# Patient Record
Sex: Male | Born: 1960 | Race: White | Hispanic: No | Marital: Single | Smoking: Current every day smoker
Health system: Southern US, Community
[De-identification: ages and names within clinical notes are randomized; demographics above are authoritative.]

## PROBLEM LIST (undated history)

## (undated) DIAGNOSIS — Z5900 Homelessness unspecified: Secondary | ICD-10-CM

## (undated) DIAGNOSIS — K623 Rectal prolapse: Secondary | ICD-10-CM

## (undated) DIAGNOSIS — F1910 Other psychoactive substance abuse, uncomplicated: Secondary | ICD-10-CM

## (undated) DIAGNOSIS — I10 Essential (primary) hypertension: Secondary | ICD-10-CM

## (undated) DIAGNOSIS — R4689 Other symptoms and signs involving appearance and behavior: Secondary | ICD-10-CM

## (undated) DIAGNOSIS — D649 Anemia, unspecified: Secondary | ICD-10-CM

## (undated) DIAGNOSIS — R4589 Other symptoms and signs involving emotional state: Secondary | ICD-10-CM

## (undated) DIAGNOSIS — K219 Gastro-esophageal reflux disease without esophagitis: Secondary | ICD-10-CM

## (undated) DIAGNOSIS — F329 Major depressive disorder, single episode, unspecified: Secondary | ICD-10-CM

## (undated) DIAGNOSIS — K922 Gastrointestinal hemorrhage, unspecified: Secondary | ICD-10-CM

## (undated) DIAGNOSIS — F32A Depression, unspecified: Secondary | ICD-10-CM

## (undated) DIAGNOSIS — J449 Chronic obstructive pulmonary disease, unspecified: Secondary | ICD-10-CM

## (undated) HISTORY — DX: Homelessness unspecified: Z59.00

## (undated) HISTORY — DX: Other symptoms and signs involving emotional state: R45.89

## (undated) HISTORY — DX: Gastrointestinal hemorrhage, unspecified: K92.2

## (undated) HISTORY — DX: Anemia, unspecified: D64.9

## (undated) HISTORY — DX: Homelessness: Z59.0

## (undated) HISTORY — DX: Other psychoactive substance abuse, uncomplicated: F19.10

## (undated) HISTORY — DX: Rectal prolapse: K62.3

## (undated) HISTORY — DX: Other symptoms and signs involving appearance and behavior: R46.89

---

## 2007-08-31 ENCOUNTER — Emergency Department (HOSPITAL_COMMUNITY): Admission: EM | Admit: 2007-08-31 | Discharge: 2007-08-31 | Payer: Self-pay | Admitting: Emergency Medicine

## 2007-12-05 ENCOUNTER — Emergency Department (HOSPITAL_COMMUNITY): Admission: EM | Admit: 2007-12-05 | Discharge: 2007-12-06 | Payer: Self-pay | Admitting: Emergency Medicine

## 2009-01-22 ENCOUNTER — Emergency Department (HOSPITAL_COMMUNITY): Admission: EM | Admit: 2009-01-22 | Discharge: 2009-01-22 | Payer: Self-pay | Admitting: *Deleted

## 2009-10-18 HISTORY — PX: LEG SURGERY: SHX1003

## 2010-02-18 ENCOUNTER — Inpatient Hospital Stay (HOSPITAL_COMMUNITY): Admission: EM | Admit: 2010-02-18 | Discharge: 2010-02-24 | Payer: Self-pay

## 2010-06-09 ENCOUNTER — Ambulatory Visit: Payer: Self-pay | Admitting: Internal Medicine

## 2010-07-09 ENCOUNTER — Emergency Department (HOSPITAL_COMMUNITY): Admission: EM | Admit: 2010-07-09 | Discharge: 2010-07-09 | Payer: Self-pay | Admitting: Emergency Medicine

## 2010-07-20 ENCOUNTER — Emergency Department (HOSPITAL_COMMUNITY): Admission: EM | Admit: 2010-07-20 | Discharge: 2010-07-20 | Payer: Self-pay | Admitting: Emergency Medicine

## 2010-11-08 ENCOUNTER — Encounter: Payer: Self-pay | Admitting: Neurosurgery

## 2010-12-31 LAB — ETHANOL: Alcohol, Ethyl (B): 224 mg/dL — ABNORMAL HIGH (ref 0–10)

## 2011-01-05 LAB — IRON AND TIBC
Saturation Ratios: 3 % — ABNORMAL LOW (ref 20–55)
UIBC: 394 ug/dL

## 2011-01-05 LAB — CBC
HCT: 26.4 % — ABNORMAL LOW (ref 39.0–52.0)
HCT: 26.6 % — ABNORMAL LOW (ref 39.0–52.0)
HCT: 27.2 % — ABNORMAL LOW (ref 39.0–52.0)
MCHC: 31.3 g/dL (ref 30.0–36.0)
MCHC: 31.7 g/dL (ref 30.0–36.0)
MCHC: 31.8 g/dL (ref 30.0–36.0)
MCV: 67.8 fL — ABNORMAL LOW (ref 78.0–100.0)
MCV: 68.1 fL — ABNORMAL LOW (ref 78.0–100.0)
MCV: 68.2 fL — ABNORMAL LOW (ref 78.0–100.0)
MCV: 68.9 fL — ABNORMAL LOW (ref 78.0–100.0)
Platelets: 387 10*3/uL (ref 150–400)
Platelets: 484 10*3/uL — ABNORMAL HIGH (ref 150–400)
RBC: 3.89 MIL/uL — ABNORMAL LOW (ref 4.22–5.81)
RBC: 3.99 MIL/uL — ABNORMAL LOW (ref 4.22–5.81)
RDW: 21.8 % — ABNORMAL HIGH (ref 11.5–15.5)
RDW: 22 % — ABNORMAL HIGH (ref 11.5–15.5)
RDW: 23.1 % — ABNORMAL HIGH (ref 11.5–15.5)
WBC: 7.3 10*3/uL (ref 4.0–10.5)
WBC: 7.4 10*3/uL (ref 4.0–10.5)
WBC: 8.1 10*3/uL (ref 4.0–10.5)
WBC: 9.8 10*3/uL (ref 4.0–10.5)

## 2011-01-05 LAB — BASIC METABOLIC PANEL
BUN: 6 mg/dL (ref 6–23)
CO2: 26 mEq/L (ref 19–32)
CO2: 26 mEq/L (ref 19–32)
Calcium: 8.2 mg/dL — ABNORMAL LOW (ref 8.4–10.5)
Chloride: 103 mEq/L (ref 96–112)
Chloride: 96 mEq/L (ref 96–112)
Creatinine, Ser: 0.56 mg/dL (ref 0.4–1.5)
Creatinine, Ser: 0.57 mg/dL (ref 0.4–1.5)
GFR calc Af Amer: >60 mL/min (ref >60)
GFR calc Af Amer: >60 mL/min (ref >60)
Glucose, Bld: 92 mg/dL (ref 70–99)
Glucose, Bld: 92 mg/dL (ref 70–99)
Potassium: 4 mEq/L (ref 3.5–5.1)
Sodium: 132 mEq/L — ABNORMAL LOW (ref 135–145)

## 2011-01-05 LAB — TYPE AND SCREEN
ABO/RH(D): O POS
Antibody Screen: NEGATIVE

## 2011-01-05 LAB — RETICULOCYTES
RBC.: 4.1 MIL/uL — ABNORMAL LOW (ref 4.22–5.81)
Retic Ct Pct: 1.1 % (ref 0.4–3.1)

## 2011-01-05 LAB — DIFFERENTIAL
Basophils Absolute: 0 10*3/uL (ref 0.0–0.1)
Basophils Absolute: 0.1 10*3/uL (ref 0.0–0.1)
Eosinophils Relative: 1 % (ref 0–5)
Lymphocytes Relative: 42 % (ref 12–46)
Lymphs Abs: 1.3 10*3/uL (ref 0.7–4.0)
Monocytes Relative: 14 % — ABNORMAL HIGH (ref 3–12)
Monocytes Relative: 5 % (ref 3–12)
Myelocytes: 0 %
Neutro Abs: 5 10*3/uL (ref 1.7–7.7)
Neutrophils Relative %: 51 % (ref 43–77)
nRBC: 0 /100{WBCs}

## 2011-01-05 LAB — FOLATE: Folate: 8.8 ng/mL

## 2011-01-05 LAB — POCT I-STAT, CHEM 8
BUN: 5 mg/dL — ABNORMAL LOW (ref 6–23)
Potassium: 3.7 meq/L (ref 3.5–5.1)
Sodium: 145 meq/L (ref 135–145)
TCO2: 22 mmol/L (ref 0–100)

## 2011-01-05 LAB — LACTIC ACID, PLASMA: Lactic Acid, Venous: 2.3 mmol/L — ABNORMAL HIGH (ref 0.5–2.2)

## 2011-01-05 LAB — COMPREHENSIVE METABOLIC PANEL
BUN: 5 mg/dL — ABNORMAL LOW (ref 6–23)
CO2: 24 mEq/L (ref 19–32)
Calcium: 7.9 mg/dL — ABNORMAL LOW (ref 8.4–10.5)
Chloride: 111 mEq/L (ref 96–112)
Creatinine, Ser: 0.67 mg/dL (ref 0.4–1.5)
GFR calc non Af Amer: >60 mL/min (ref >60)
Total Bilirubin: 0.3 mg/dL (ref 0.3–1.2)

## 2011-01-05 LAB — POCT CARDIAC MARKERS
CKMB, poc: 1 ng/mL (ref 1.0–8.0)
Myoglobin, poc: 338 ng/mL (ref 12–200)

## 2011-01-05 LAB — APTT: aPTT: 27 seconds (ref 24–37)

## 2011-01-05 LAB — TSH: TSH: 2.711 u[IU]/mL (ref 0.350–4.500)

## 2011-01-05 LAB — MRSA PCR SCREENING: MRSA by PCR: NEGATIVE

## 2011-01-05 LAB — HEMOGLOBIN AND HEMATOCRIT, BLOOD
HCT: 26.1 % — ABNORMAL LOW (ref 39.0–52.0)
Hemoglobin: 8.3 g/dL — ABNORMAL LOW (ref 13.0–17.0)

## 2011-01-05 LAB — PROTIME-INR
INR: 1.05 (ref 0.00–1.49)
Prothrombin Time: 13.6 seconds (ref 11.6–15.2)

## 2011-03-30 ENCOUNTER — Emergency Department (HOSPITAL_COMMUNITY): Payer: Self-pay

## 2011-03-30 ENCOUNTER — Emergency Department (HOSPITAL_COMMUNITY)
Admission: EM | Admit: 2011-03-30 | Discharge: 2011-03-30 | Disposition: A | Discharge status: Home / Self Care | Payer: Self-pay | Attending: Emergency Medicine | Admitting: Emergency Medicine

## 2011-03-30 DIAGNOSIS — G8918 Other acute postprocedural pain: Secondary | ICD-10-CM | POA: Insufficient documentation

## 2011-03-30 DIAGNOSIS — M25473 Effusion, unspecified ankle: Secondary | ICD-10-CM | POA: Insufficient documentation

## 2011-03-30 DIAGNOSIS — K219 Gastro-esophageal reflux disease without esophagitis: Secondary | ICD-10-CM | POA: Insufficient documentation

## 2011-03-30 DIAGNOSIS — M25579 Pain in unspecified ankle and joints of unspecified foot: Secondary | ICD-10-CM | POA: Insufficient documentation

## 2011-03-30 DIAGNOSIS — M25476 Effusion, unspecified foot: Secondary | ICD-10-CM | POA: Insufficient documentation

## 2011-04-01 ENCOUNTER — Ambulatory Visit (HOSPITAL_COMMUNITY): Payer: Self-pay

## 2011-04-01 ENCOUNTER — Ambulatory Visit (HOSPITAL_COMMUNITY)
Admission: RE | Admit: 2011-04-01 | Discharge: 2011-04-02 | Disposition: A | Discharge status: Home / Self Care | Payer: Self-pay | Source: Ambulatory Visit | Attending: Orthopedic Surgery | Admitting: Orthopedic Surgery

## 2011-04-01 DIAGNOSIS — Z5900 Homelessness unspecified: Secondary | ICD-10-CM | POA: Insufficient documentation

## 2011-04-01 DIAGNOSIS — Y831 Surgical operation with implant of artificial internal device as the cause of abnormal reaction of the patient, or of later complication, without mention of misadventure at the time of the procedure: Secondary | ICD-10-CM | POA: Insufficient documentation

## 2011-04-01 DIAGNOSIS — Z01812 Encounter for preprocedural laboratory examination: Secondary | ICD-10-CM | POA: Insufficient documentation

## 2011-04-01 DIAGNOSIS — T8489XA Other specified complication of internal orthopedic prosthetic devices, implants and grafts, initial encounter: Secondary | ICD-10-CM | POA: Insufficient documentation

## 2011-04-01 LAB — SURGICAL PCR SCREEN
MRSA, PCR: NEGATIVE
Staphylococcus aureus: POSITIVE — AB

## 2011-04-01 LAB — CBC
Platelets: 385 10*3/uL (ref 150–400)
RDW: 19.1 % — ABNORMAL HIGH (ref 11.5–15.5)
WBC: 11.2 10*3/uL — ABNORMAL HIGH (ref 4.0–10.5)
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
## 2011-07-02 ENCOUNTER — Emergency Department (HOSPITAL_COMMUNITY): Payer: Self-pay

## 2011-07-02 ENCOUNTER — Emergency Department (HOSPITAL_COMMUNITY)
Admission: EM | Admit: 2011-07-02 | Discharge: 2011-07-02 | Disposition: A | Discharge status: Home / Self Care | Payer: Self-pay | Attending: Emergency Medicine | Admitting: Emergency Medicine

## 2011-07-02 DIAGNOSIS — M79609 Pain in unspecified limb: Secondary | ICD-10-CM | POA: Insufficient documentation

## 2011-07-02 DIAGNOSIS — M25476 Effusion, unspecified foot: Secondary | ICD-10-CM | POA: Insufficient documentation

## 2011-07-02 DIAGNOSIS — M25473 Effusion, unspecified ankle: Secondary | ICD-10-CM | POA: Insufficient documentation

## 2011-07-02 DIAGNOSIS — M25579 Pain in unspecified ankle and joints of unspecified foot: Secondary | ICD-10-CM | POA: Insufficient documentation

## 2011-07-02 DIAGNOSIS — K219 Gastro-esophageal reflux disease without esophagitis: Secondary | ICD-10-CM | POA: Insufficient documentation

## 2011-07-22 ENCOUNTER — Emergency Department (HOSPITAL_COMMUNITY)
Admission: EM | Admit: 2011-07-22 | Discharge: 2011-07-23 | Disposition: A | Discharge status: Home / Self Care | Payer: Self-pay | Attending: Emergency Medicine | Admitting: Emergency Medicine

## 2011-07-22 DIAGNOSIS — M25476 Effusion, unspecified foot: Secondary | ICD-10-CM | POA: Insufficient documentation

## 2011-07-22 DIAGNOSIS — M25579 Pain in unspecified ankle and joints of unspecified foot: Secondary | ICD-10-CM | POA: Insufficient documentation

## 2011-07-22 DIAGNOSIS — K219 Gastro-esophageal reflux disease without esophagitis: Secondary | ICD-10-CM | POA: Insufficient documentation

## 2011-07-22 DIAGNOSIS — M25473 Effusion, unspecified ankle: Secondary | ICD-10-CM | POA: Insufficient documentation

## 2011-11-21 ENCOUNTER — Emergency Department (HOSPITAL_COMMUNITY): Payer: Self-pay

## 2011-11-21 ENCOUNTER — Encounter (HOSPITAL_COMMUNITY): Payer: Self-pay | Admitting: *Deleted

## 2011-11-21 ENCOUNTER — Emergency Department (HOSPITAL_COMMUNITY)
Admission: EM | Admit: 2011-11-21 | Discharge: 2011-11-22 | Disposition: A | Discharge status: Home / Self Care | Payer: Self-pay | Attending: Emergency Medicine | Admitting: Emergency Medicine

## 2011-11-21 DIAGNOSIS — S0003XA Contusion of scalp, initial encounter: Secondary | ICD-10-CM | POA: Insufficient documentation

## 2011-11-21 DIAGNOSIS — R6884 Jaw pain: Secondary | ICD-10-CM | POA: Insufficient documentation

## 2011-11-21 DIAGNOSIS — S0083XA Contusion of other part of head, initial encounter: Secondary | ICD-10-CM

## 2011-11-21 DIAGNOSIS — S61511A Laceration without foreign body of right wrist, initial encounter: Secondary | ICD-10-CM

## 2011-11-21 DIAGNOSIS — S61509A Unspecified open wound of unspecified wrist, initial encounter: Secondary | ICD-10-CM | POA: Insufficient documentation

## 2011-11-21 DIAGNOSIS — R51 Headache: Secondary | ICD-10-CM | POA: Insufficient documentation

## 2011-11-21 DIAGNOSIS — M542 Cervicalgia: Secondary | ICD-10-CM | POA: Insufficient documentation

## 2011-11-21 MED ORDER — TETANUS-DIPHTH-ACELL PERTUSSIS 5-2.5-18.5 LF-MCG/0.5 IM SUSP
0.5000 mL | Freq: Once | INTRAMUSCULAR | Status: AC
Start: 2011-11-21 — End: 2011-11-21
  Administered 2011-11-21: 0.5 mL via INTRAMUSCULAR
  Filled 2011-11-21: qty 0.5
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
## 2011-11-22 MED ORDER — OXYCODONE-ACETAMINOPHEN 5-325 MG PO TABS: 1.0000 | ORAL_TABLET | Freq: Four times a day (QID) | ORAL | Status: DC | PRN

## 2011-11-28 ENCOUNTER — Encounter (HOSPITAL_COMMUNITY): Payer: Self-pay | Admitting: Emergency Medicine

## 2011-11-28 ENCOUNTER — Emergency Department (HOSPITAL_COMMUNITY)
Admission: EM | Admit: 2011-11-28 | Discharge: 2011-11-28 | Disposition: A | Discharge status: Other Acute Inpatient Hospital | Payer: Self-pay | Attending: Emergency Medicine | Admitting: Emergency Medicine

## 2011-11-28 ENCOUNTER — Telehealth (HOSPITAL_COMMUNITY): Payer: Self-pay | Admitting: Licensed Clinical Social Worker

## 2011-11-28 ENCOUNTER — Emergency Department (HOSPITAL_COMMUNITY): Payer: Self-pay

## 2011-11-28 ENCOUNTER — Inpatient Hospital Stay (HOSPITAL_COMMUNITY)
Admission: RE | Admit: 2011-11-28 | Discharge: 2011-12-06 | DRG: 897 | Disposition: A | Discharge status: Home / Self Care | Payer: PRIVATE HEALTH INSURANCE | Source: Ambulatory Visit | Attending: Psychiatry | Admitting: Psychiatry

## 2011-11-28 DIAGNOSIS — F1994 Other psychoactive substance use, unspecified with psychoactive substance-induced mood disorder: Secondary | ICD-10-CM

## 2011-11-28 DIAGNOSIS — K921 Melena: Secondary | ICD-10-CM | POA: Insufficient documentation

## 2011-11-28 DIAGNOSIS — J449 Chronic obstructive pulmonary disease, unspecified: Secondary | ICD-10-CM | POA: Insufficient documentation

## 2011-11-28 DIAGNOSIS — F17200 Nicotine dependence, unspecified, uncomplicated: Secondary | ICD-10-CM | POA: Insufficient documentation

## 2011-11-28 DIAGNOSIS — F1020 Alcohol dependence, uncomplicated: Principal | ICD-10-CM

## 2011-11-28 DIAGNOSIS — F322 Major depressive disorder, single episode, severe without psychotic features: Secondary | ICD-10-CM

## 2011-11-28 DIAGNOSIS — K922 Gastrointestinal hemorrhage, unspecified: Secondary | ICD-10-CM

## 2011-11-28 DIAGNOSIS — J4489 Other specified chronic obstructive pulmonary disease: Secondary | ICD-10-CM | POA: Insufficient documentation

## 2011-11-28 DIAGNOSIS — K623 Rectal prolapse: Secondary | ICD-10-CM | POA: Insufficient documentation

## 2011-11-28 DIAGNOSIS — I10 Essential (primary) hypertension: Secondary | ICD-10-CM

## 2011-11-28 DIAGNOSIS — R45851 Suicidal ideations: Secondary | ICD-10-CM

## 2011-11-28 DIAGNOSIS — K5900 Constipation, unspecified: Secondary | ICD-10-CM | POA: Insufficient documentation

## 2011-11-28 DIAGNOSIS — K219 Gastro-esophageal reflux disease without esophagitis: Secondary | ICD-10-CM

## 2011-11-28 DIAGNOSIS — F1010 Alcohol abuse, uncomplicated: Secondary | ICD-10-CM | POA: Insufficient documentation

## 2011-11-28 DIAGNOSIS — M79609 Pain in unspecified limb: Secondary | ICD-10-CM

## 2011-11-28 DIAGNOSIS — D649 Anemia, unspecified: Secondary | ICD-10-CM | POA: Insufficient documentation

## 2011-11-28 DIAGNOSIS — Z590 Homelessness: Secondary | ICD-10-CM

## 2011-11-28 DIAGNOSIS — Z880 Allergy status to penicillin: Secondary | ICD-10-CM

## 2011-11-28 DIAGNOSIS — R51 Headache: Secondary | ICD-10-CM

## 2011-11-28 DIAGNOSIS — Z886 Allergy status to analgesic agent status: Secondary | ICD-10-CM

## 2011-11-28 DIAGNOSIS — R195 Other fecal abnormalities: Secondary | ICD-10-CM | POA: Diagnosis present

## 2011-11-28 DIAGNOSIS — F4310 Post-traumatic stress disorder, unspecified: Secondary | ICD-10-CM

## 2011-11-28 HISTORY — DX: Chronic obstructive pulmonary disease, unspecified: J44.9

## 2011-11-28 HISTORY — DX: Gastro-esophageal reflux disease without esophagitis: K21.9

## 2011-11-28 HISTORY — DX: Essential (primary) hypertension: I10

## 2011-11-28 LAB — CBC
MCHC: 32.2 g/dL (ref 30.0–36.0)
Platelets: 318 10*3/uL (ref 150–400)
RDW: 21.2 % — ABNORMAL HIGH (ref 11.5–15.5)
WBC: 6.9 10*3/uL (ref 4.0–10.5)

## 2011-11-28 LAB — COMPREHENSIVE METABOLIC PANEL
AST: 49 U/L — ABNORMAL HIGH (ref 0–37)
Albumin: 3.6 g/dL (ref 3.5–5.2)
Alkaline Phosphatase: 88 U/L (ref 39–117)
Chloride: 103 mEq/L (ref 96–112)
Potassium: 3.9 mEq/L (ref 3.5–5.1)
Total Bilirubin: 0.1 mg/dL — ABNORMAL LOW (ref 0.3–1.2)
Total Protein: 8.4 g/dL — ABNORMAL HIGH (ref 6.0–8.3)

## 2011-11-28 LAB — RAPID URINE DRUG SCREEN, HOSP PERFORMED
Amphetamines: NOT DETECTED
Barbiturates: NOT DETECTED
Benzodiazepines: NOT DETECTED
Cocaine: NOT DETECTED

## 2011-11-28 LAB — ACETAMINOPHEN LEVEL: Acetaminophen (Tylenol), Serum: <15 ug/mL (ref 10–30)

## 2011-11-28 MED ORDER — LORAZEPAM 1 MG PO TABS
1.0000 mg | ORAL_TABLET | Freq: Four times a day (QID) | ORAL | Status: DC | PRN
  Administered 2011-11-28: 1 mg via ORAL
  Filled 2011-11-28: qty 1

## 2011-11-28 MED ORDER — LORAZEPAM 1 MG PO TABS
0.0000 mg | ORAL_TABLET | Freq: Four times a day (QID) | ORAL | Status: DC
  Administered 2011-11-28: 1 mg via ORAL
  Filled 2011-11-28: qty 1

## 2011-11-28 MED ORDER — LORAZEPAM 1 MG PO TABS: 1.0000 mg | ORAL_TABLET | Freq: Three times a day (TID) | ORAL | Status: DC | PRN

## 2011-11-28 MED ORDER — ONDANSETRON HCL 4 MG PO TABS: 4.0000 mg | ORAL_TABLET | Freq: Three times a day (TID) | ORAL | Status: DC | PRN

## 2011-11-28 MED ORDER — HYDROXYZINE PAMOATE 25 MG PO CAPS: 25.0000 mg | ORAL_CAPSULE | Freq: Every evening | ORAL | Status: DC | PRN

## 2011-11-28 MED ORDER — BACITRACIN 500 UNIT/GM EX OINT
1.0000 "application " | TOPICAL_OINTMENT | Freq: Two times a day (BID) | CUTANEOUS | Status: DC
  Filled 2011-11-28: qty 0.9

## 2011-11-28 MED ORDER — LORAZEPAM 1 MG PO TABS: 1.0000 mg | ORAL_TABLET | Freq: Four times a day (QID) | ORAL | Status: DC | PRN

## 2011-11-28 MED ORDER — IOHEXOL 300 MG/ML  SOLN
100.0000 mL | Freq: Once | INTRAMUSCULAR | Status: AC | PRN
Start: 2011-11-28 — End: 2011-11-28
  Administered 2011-11-28: 100 mL via INTRAVENOUS

## 2011-11-28 MED ORDER — ACETAMINOPHEN 325 MG PO TABS
650.0000 mg | ORAL_TABLET | Freq: Four times a day (QID) | ORAL | Status: DC | PRN
  Administered 2011-11-29 – 2011-12-01 (×5): 650 mg via ORAL

## 2011-11-28 MED ORDER — MAGNESIUM HYDROXIDE 400 MG/5ML PO SUSP: 30.0000 mL | Freq: Every day | ORAL | Status: DC | PRN

## 2011-11-28 MED ORDER — BACITRACIN ZINC 500 UNIT/GM EX OINT
TOPICAL_OINTMENT | CUTANEOUS | Status: AC
  Administered 2011-11-28: 1
  Filled 2011-11-28: qty 0.9

## 2011-11-28 MED ORDER — ACETAMINOPHEN 325 MG PO TABS: 650.0000 mg | ORAL_TABLET | ORAL | Status: DC | PRN

## 2011-11-28 MED ORDER — LORAZEPAM 2 MG/ML IJ SOLN: 1.0000 mg | Freq: Four times a day (QID) | INTRAMUSCULAR | Status: DC | PRN

## 2011-11-28 MED ORDER — ZOLPIDEM TARTRATE 5 MG PO TABS: 5.0000 mg | ORAL_TABLET | Freq: Every evening | ORAL | Status: DC | PRN

## 2011-11-28 MED ORDER — BACITRACIN ZINC 500 UNIT/GM EX OINT
TOPICAL_OINTMENT | Freq: Two times a day (BID) | CUTANEOUS | Status: DC
  Administered 2011-11-28: 21:00:00 via TOPICAL
  Filled 2011-11-28: qty 15

## 2011-11-28 MED ORDER — LORAZEPAM 1 MG PO TABS: 0.0000 mg | ORAL_TABLET | Freq: Two times a day (BID) | ORAL | Status: DC

## 2011-11-28 MED ORDER — ALUM & MAG HYDROXIDE-SIMETH 200-200-20 MG/5ML PO SUSP: 30.0000 mL | ORAL | Status: DC | PRN

## 2011-11-28 MED ORDER — LORAZEPAM 2 MG/ML IJ SOLN
1.0000 mg | Freq: Once | INTRAMUSCULAR | Status: AC
  Administered 2011-11-28: 1 mg via INTRAMUSCULAR
  Filled 2011-11-28: qty 1
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
## 2011-11-28 NOTE — ED Notes (Signed)
MD at bedside. 
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
## 2011-11-28 NOTE — ED Provider Notes (Addendum)
Complains of feeling suicidal or throw himself in front of a train. Also reports has had diffuse abdominal pain for several months with rectal bleeding. On exam alert nontoxic Glasgow Coma Score 15 . Heart regular rate and rhythm lungs clear to auscultation abdomen nondistended minimal diffuse tenderness genitalia normal rectum minimal prolapsed and tender. I am unable to push rectum back easily with digital pressure due to patient's discomfort  Doug Sou, MD 11/28/11 1359  Doug Sou, MD 11/28/11 1646

## 2011-11-28 NOTE — BH Assessment (Signed)
Pt has been accepted to [facility] by Lynann Bologna NP to [facility] MD, bed 305-1. Support paperwork completed and faxed to [facility]. EDP and pt's RN notified.

## 2011-11-28 NOTE — ED Notes (Addendum)
Presents with SI to jump in front of train. ETOH abuse, last drink this AM. approxiamately 4 shots of whiskey. Denies drug use. Reports rectal bleeding that began 4 months ago, described as moderate amount of blood.

## 2011-11-28 NOTE — ED Provider Notes (Signed)
Medical screening examination/treatment/procedure(s) were conducted as a shared visit with non-physician practitioner(s) and myself.  I personally evaluated the patient during the encounter  Doug Sou, MD 11/28/11 1643

## 2011-11-28 NOTE — ED Notes (Signed)
Patient is resting comfortably. 

## 2011-11-28 NOTE — ED Notes (Signed)
Three belonging bags with name tag locked in locker 37
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
## 2011-11-29 ENCOUNTER — Telehealth (HOSPITAL_COMMUNITY): Payer: Self-pay | Admitting: Licensed Clinical Social Worker

## 2011-11-29 DIAGNOSIS — Z5900 Homelessness unspecified: Secondary | ICD-10-CM

## 2011-11-29 DIAGNOSIS — F1020 Alcohol dependence, uncomplicated: Secondary | ICD-10-CM | POA: Diagnosis present

## 2011-11-29 MED ORDER — CHLORDIAZEPOXIDE HCL 25 MG PO CAPS
25.0000 mg | ORAL_CAPSULE | ORAL | Status: AC
  Administered 2011-12-01 (×2): 25 mg via ORAL
  Filled 2011-11-29 (×2): qty 1

## 2011-11-29 MED ORDER — ADULT MULTIVITAMIN W/MINERALS CH
1.0000 | ORAL_TABLET | Freq: Every day | ORAL | Status: DC
  Administered 2011-11-29 – 2011-12-05 (×7): 1 via ORAL
  Filled 2011-11-29 (×7): qty 1

## 2011-11-29 MED ORDER — CHLORDIAZEPOXIDE HCL 25 MG PO CAPS
25.0000 mg | ORAL_CAPSULE | Freq: Three times a day (TID) | ORAL | Status: AC
  Administered 2011-11-30 (×3): 25 mg via ORAL
  Filled 2011-11-29 (×3): qty 1

## 2011-11-29 MED ORDER — CHLORDIAZEPOXIDE HCL 25 MG PO CAPS: 25.0000 mg | ORAL_CAPSULE | Freq: Four times a day (QID) | ORAL | Status: AC | PRN

## 2011-11-29 MED ORDER — THIAMINE HCL 100 MG/ML IJ SOLN
100.0000 mg | Freq: Once | INTRAMUSCULAR | Status: AC
  Administered 2011-11-29: 100 mg via INTRAMUSCULAR

## 2011-11-29 MED ORDER — HYDROXYZINE HCL 25 MG PO TABS
25.0000 mg | ORAL_TABLET | Freq: Four times a day (QID) | ORAL | Status: AC | PRN
  Administered 2011-11-29 – 2011-12-01 (×3): 25 mg via ORAL

## 2011-11-29 MED ORDER — CHLORDIAZEPOXIDE HCL 25 MG PO CAPS
25.0000 mg | ORAL_CAPSULE | Freq: Every day | ORAL | Status: AC
  Administered 2011-12-02: 25 mg via ORAL
  Filled 2011-11-29: qty 1

## 2011-11-29 MED ORDER — CHLORDIAZEPOXIDE HCL 25 MG PO CAPS
50.0000 mg | ORAL_CAPSULE | Freq: Once | ORAL | Status: AC
  Administered 2011-11-29: 50 mg via ORAL
  Filled 2011-11-29: qty 2

## 2011-11-29 MED ORDER — ONDANSETRON 4 MG PO TBDP: 4.0000 mg | ORAL_TABLET | Freq: Four times a day (QID) | ORAL | Status: AC | PRN

## 2011-11-29 MED ORDER — CHLORDIAZEPOXIDE HCL 25 MG PO CAPS
25.0000 mg | ORAL_CAPSULE | Freq: Four times a day (QID) | ORAL | Status: AC
  Administered 2011-11-29 (×3): 25 mg via ORAL
  Filled 2011-11-29 (×4): qty 1

## 2011-11-29 MED ORDER — VITAMIN B-1 100 MG PO TABS
100.0000 mg | ORAL_TABLET | Freq: Every day | ORAL | Status: DC
  Administered 2011-11-30 – 2011-12-05 (×6): 100 mg via ORAL
  Filled 2011-11-29 (×8): qty 1

## 2011-11-29 MED ORDER — LOPERAMIDE HCL 2 MG PO CAPS: 2.0000 mg | ORAL_CAPSULE | ORAL | Status: AC | PRN
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
## 2011-11-29 NOTE — Progress Notes (Signed)
[facility] Group Notes:  (Counselor/Nursing/MHT/Case Management/Adjunct)  11/29/2011 1:15PM  Type of Therapy:  Group Therapy  Participation Level:  Active  Participation Quality:  Attentive and Sharing  Affect:  Appropriate  Cognitive:  Alert and Oriented  Insight:  Good  Engagement in Group:  Good  Engagement in Therapy:  Good  Modes of Intervention:  Clarification, Education, Problem-solving, Support and Exploratory  Summary of Progress/Problems:  Pt was able to state that he wants to go back to work. Pt verbalized that he is looking forward to going to [facility]. Pt verbalized that he hasn't received that type of treatment before. Pt verbalized that he continues to have a relationship with his wife as well as with his girlfriend. Pt did verbalize that he had relocated to another city. Pt verbalized that he is looking forward to making positive changes in his life.  Rayetta Veith, Randal Buba 11/29/2011, 3:11 PM
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
## 2011-11-29 NOTE — Progress Notes (Signed)
Patient ID: Terry Bradley, male   DOB: 12/28/1960, 51 y.o.   MRN: [id number] (D) Patient took quite a bit of encouragement to come to the medication window for his AM medications. Once at the window he was cooperative. Patient appears fidgety with minimal eye contact. (A) Patient given his loading dose Thiamine injection as noted on MAR. Patient escorted to discharge planning group when medications administered. Patient encouraged to participate in groups throughout day.(R) Patient tolerated injection well. Polite and cooperative.

## 2011-11-29 NOTE — Progress Notes (Addendum)
Patient ID: Terry Bradley, male   DOB: 01-29-1961, 51 y.o.   MRN: [id number] Patient polite and appropriate this evening. Denies SI/HI/AV. Patient had complaint of headache, and was then medicated with tylenol. Patient laying in bed this evening upon assessment. Patient instructed to increase fluid intake as dehydration can sometimes cause headaches. Patient verbalized understandment. Patient given large cup of ice water. Verbalizes no other complaints other than stated headache. Stated that he is looking into [facility] after discharge but wants to stay within the [place] area.
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
## 2011-11-29 NOTE — Progress Notes (Signed)
Pt attended discharge planning group and actively participated.  Pt presents with flat affect and depressed mood.  Pt was open with sharing reason for entering the hospital.  Pt states that he is an alcoholic for over 30 years.  Pt states that he has never been to treatment before and appears motivated for treatment.  Pt states that he came here to detox and get further treatment to stay sober.  Pt open to referral in [facility].  Pt states that he has been homeless, living under a bridge on [address]. in [place] for the past 6 years.  Pt states he won't stay at the homeless shelter because it is a bad environment for him.  SW will refer pt to [facility] - bed available on Wednesday 2/13.  Pt will be transported there by bus and picked up by [facility] [place].  Pt ranks depression at a 9 and anxiety at a 7-8 today.  Pt reports SI today but contracts for safety.  Safety planning and suicide prevention discussed.     Terry Bradley, LCSWA 11/29/2011  10:14 AM

## 2011-11-29 NOTE — Progress Notes (Signed)
BHH Group Notes:  (Counselor/Nursing/MHT/Case Management/Adjunct)  11/29/2011 12:10 PM  Type of Therapy:  Group Therapy at 11:00  Participation Level:  Did Not Attend   Terry Bradley 11/29/2011, 12:10 PM

## 2011-11-30 MED ORDER — CITALOPRAM HYDROBROMIDE 20 MG PO TABS
20.0000 mg | ORAL_TABLET | Freq: Every day | ORAL | Status: DC
  Administered 2011-11-30 – 2011-12-05 (×6): 20 mg via ORAL
  Filled 2011-11-30 (×3): qty 1
  Filled 2011-11-30: qty 3
  Filled 2011-11-30 (×5): qty 1

## 2011-11-30 MED ORDER — CITALOPRAM HYDROBROMIDE 10 MG PO TABS: 10.0000 mg | ORAL_TABLET | Freq: Every day | ORAL | Status: DC

## 2011-11-30 MED ORDER — NICOTINE POLACRILEX 2 MG MT GUM
2.0000 mg | CHEWING_GUM | OROMUCOSAL | Status: DC | PRN
  Administered 2011-11-30: 2 mg via ORAL

## 2011-11-30 NOTE — Psych (Signed)
Above nursing student note read and reviewed.

## 2011-11-30 NOTE — Progress Notes (Signed)
Recreation Therapy Notes  11/30/2011         Time: 1415      Group Topic/Focus: The focus of this group is on discussing various styles of communication and communicating assertively using 'I' (feeling) statements.  Participation Level: Active  Participation Quality: Attentive  Affect: Appropriate  Cognitive: Oriented   Additional Comments: None.   Errik Mitchelle 11/30/2011 4:13 PM 
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
## 2011-11-30 NOTE — Progress Notes (Signed)
Pt attended discharge planning group and actively participated.  Pt presents with calm mood and affect.  Pt ranks depression and anxiety at a 5 today.  Pt denies SI.  SW informed pt that he would be going to [facility] on Wednesday.  Pt was smiling and appeared excited about this.  Pt states that he is nervous about going to further treatment but eager to stay sober.  Pt will go to [facility] tomorrow and will utilize the bus system to get there.  SW provided pt with a bus pass.  Safety planning and suicide prevention discussed.     Reyes Ivan, LCSWA 11/30/2011  9:12 AM

## 2011-11-30 NOTE — Tx Team (Signed)
Interdisciplinary Treatment Plan Update (Adult)  Date:  11/30/2011  Time Reviewed:  10:56 AM   Progress in Treatment: Attending groups: Yes Participating in groups:  Yes Taking medication as prescribed: Yes Tolerating medication:  Yes Family/Significant othe contact made:   Patient understands diagnosis:  Yes Discussing patient identified problems/goals with staff:  Yes Medical problems stabilized or resolved:  Yes Denies suicidal/homicidal ideation: Yes Issues/concerns per patient self-inventory:  None identified Other: N/A  New problem(s) identified: None Identified  Reason for Continuation of Hospitalization: Anxiety Depression Medication stabilization Withdrawal symptoms  Interventions implemented related to continuation of hospitalization: mood stabilization, medication monitoring and adjustment, group therapy and psycho education, safety checks q 15 mins  Additional comments: N/A  Estimated length of stay: 1 day  Discharge Plan: Pt will follow up at [facility] for further substance abuse treatment  New goal(s): N/A  Review of initial/current patient goals per problem list:    1.  Goal(s): Address substance use  Met:  No  Target date: by discharge  As evidenced by: completing detox protocol and refer to appropriate treatment  2.  Goal (s): Reduce depressive and anxiety symptoms  Met:  No  Target date: by discharge  As evidenced by: Reducing depression from a 10 to a 3 as reported by pt.  Pt ranks both at a 5 today.   3.  Goal(s): Eliminate SI  Met:  No  Target date: by discharge  As evidenced by: pt denying SI   Attendees: Patient:  Terry Bradley 11/30/2011 10:59 AM   Family:     Physician:  Lupe Carney, DO 11/30/2011 10:56 AM   Nursing: Quintella Reichert, RN 11/30/2011 10:56 AM   Case Manager:  Reyes Ivan, LCSWA 11/30/2011  10:56 AM   Counselor:  Ronda Fairly, LCSWA 11/30/2011  10:56 AM   Other:  Neill Loft, RN 11/30/2011  10:56 AM   Other:  Verne Spurr, PA 11/30/2011 10:59 AM   Other:     Other:      Scribe for Treatment Team:   Reyes Ivan 11/30/2011 10:56 AM

## 2011-11-30 NOTE — Progress Notes (Signed)
Pt is in his room with sitter at his bedside.  Pt was put on 1:1 earlier today after expressing thoughts to hurt himself.  He also has some med changes that were made today.  In this conversation, pt reports he has no thoughts of harming himself and wants to come off the 1:1.  Explained to the pt that only the MD could discontinue the 1:1 and he would need to see her in the morning.  He was originally planning to leave for [facility] tomorrow, but was told that he may be staying until Monday.  He voices no needs/concerns at this time.  Pt is safe at this time.

## 2011-11-30 NOTE — Psych (Addendum)
Patient reports chronic auditory hallucinations of deceased parents and brother talking to him.  States that is a "scary" experience.  Also reports chronic visual hallucinations of "people walking by".  These have been happening for some time and not certain if related to alcohol use.  These were observed during CIWA under supervison of Nursing Instructor, Ashley Akin, MSN, RN, Haroldine Laws

## 2011-12-01 DIAGNOSIS — K922 Gastrointestinal hemorrhage, unspecified: Secondary | ICD-10-CM

## 2011-12-01 DIAGNOSIS — R195 Other fecal abnormalities: Secondary | ICD-10-CM

## 2011-12-01 DIAGNOSIS — K623 Rectal prolapse: Secondary | ICD-10-CM | POA: Diagnosis present

## 2011-12-01 DIAGNOSIS — D649 Anemia, unspecified: Secondary | ICD-10-CM

## 2011-12-01 NOTE — Progress Notes (Signed)
Pt in attendance at morning group. Pt reports being in a good mood and no SI. Plan is to f/u at [facility] on Monday 12/06/11.  Seen by student today.

## 2011-12-01 NOTE — Progress Notes (Signed)
Patient ID: Terry Bradley, male   DOB: [birth year], 51 y.o.   MRN: [id number] Aubrey met with the treatment team this morning to discuss his care and follow up care when he discharges.  He was on 1:1 last pm and started on an SSRI.  He voices no SI this morning, and states that he ate well and slept well last night.  He notes that he does feel safe on the unit and would speak with a staff member should he feel unsafe.  Mr. Tappan voices no side effects to the medication and is agreeable to continue taking it.  VS. Are 131/86 Temp 98.4 P is 67 and R is 18 He is in hospital gown. And slightly disheveled.  He makes good eye contact with each team member and his speech is clear.  His affect is smiling and positive.  He denies SI/HI today and shows no AH/VH and appears to be in full contact with reality.  He is not guarded in his answers and shows some insight into getting treatment.  He is motivated to pursue sobriety.  A.) Detox day 3       MDD severe with SI       Heme+ stool P.) Continue Detox protocol, SSRI, and he will go to [facility] on Monday when bed is available.      1:1 is discontinued.  Rona Ravens. Lovel Suazo PAC

## 2011-12-01 NOTE — Progress Notes (Signed)
BHH Group Notes:  (Counselor/Nursing/MHT/Case Management/Adjunct)  12/01/2011 2:42 PM  Type of Therapy:  Group Therapy at 11:00  Participation Level:  Minimal  Participation Quality:  Attentive and quiet  Affect:  Appropriate  Cognitive:  Alert and Oriented  Insight:  Unknown  Engagement in Group:  None other than attentive  Engagement in Therapy: None other than attentive  Modes of Intervention:  Socialization and exploration  Summary of Progress/Problems:  Pt was quiet yet attentive throughout group; did not respond to direct prompting from Clinical research associate.   Clide Dales 12/01/2011, 2:42 PM

## 2011-12-01 NOTE — Progress Notes (Signed)
Patient ID: Terry Bradley, male   DOB: 1961/01/10, 50 y.o.   MRN: [id number] Patient stated he is feeling better. Denies SI/HI/AH/VH. Interacting appropriately on the unit.

## 2011-12-01 NOTE — Progress Notes (Signed)
Pt taken off 1:1 observation at 1040. Pt appropriate, smiling upon approach. Pt reports passive si and contracts with Clinical research associate for safety.

## 2011-12-01 NOTE — Progress Notes (Signed)
Pt appropriate and cooperative. He reports si thoughts off and on and contracts with Clinical research associate for safety. Pt denies hi and any hallucinations. Gave prn medication for headache and staff continues to monitor pt 1:1. Pt remains safe on unit.

## 2011-12-01 NOTE — Progress Notes (Signed)
BHH Group Notes:  (Counselor/Nursing/MHT/Case Management/Adjunct)  12/01/2011 9:14 AM  Type of Therapy:  Group Therapy at 11:00 on 11/30/11  Participation Level:  Minimal  Participation Quality:  Attentive and Sharing  Affect:  Depressed  Cognitive:  Alert and Oriented  Insight:  Limited  Engagement in Group:  Good  Modes of Intervention:  Clarification, Socialization and Support  Summary of Progress/Problems:  Patient shared "feelings of hopelessness, guilt, and shame as to what my life is" Patient shared "I've been drinking    Clide Dales 12/01/2011, 9:14 AM  BHH Group Notes:  (Counselor/Nursing/MHT/Case Management/Adjunct)  12/01/2011 2:37 PM  Type of Therapy:  Group Therapy at 1:1`5 on 11/30/11  Participation Level:  Active  Participation Quality:  Attentive and Sharing  Affect:  Appropriate  Cognitive:  Alert and Oriented  Insight:  Limited  Engagement in Group:  Good  Engagement in Therapy:  Limited  Modes of Intervention:  Clarification, Socialization and Support  Summary of Progress/Problems:  Group decision explored patients identification with concept of addiction as a disease. Terry Bradley shared once again how he began drinking at age of 89 and has used alcohol his entire life. "I know I need to quit or I'm going to die yet what am I supposed to live fore, how am I supposed to live?"   Clide Dales 12/01/2011, 9:14 AM

## 2011-12-02 MED ORDER — HYDROXYZINE HCL 50 MG PO TABS
50.0000 mg | ORAL_TABLET | Freq: Once | ORAL | Status: AC | PRN
  Administered 2011-12-02: 50 mg via ORAL

## 2011-12-02 NOTE — Progress Notes (Signed)
Patient ID: Terry Bradley, male   DOB: 09-Jun-1961, 51 y.o.   MRN: [id number] Patient polite and appropriate this evening. Patient had finished detox protocol this morning. Patient reports some anxiety this evening. Patient does not appear to be anxious. States did not sleep well last night, has been resting today in room. Verbalizes no other complaints this evening.

## 2011-12-02 NOTE — Progress Notes (Signed)
[facility] Adult Inpatient Family/Significant Other Suicide Prevention Education  Suicide Prevention Education:  Patient Refusal for Family/Significant Other Suicide Prevention Education: The patient Terry Bradley has refused to provide written consent for family/significant other to be provided Family/Significant Other Suicide Prevention Education during admission and/or prior to discharge.  Physician notified. Writer provided suicide prevention education directly to patient; conversation included risk factors, warning signs and resources to contact for help. Mobile crisis services explained and contact card placed in chart for pt to receive at discharge. Discharge plan as of today is for patient to enter treatment center directly from [facility] next week.   Clide Dales 12/02/2011, 5:44 PM

## 2011-12-02 NOTE — Progress Notes (Signed)
Patient ID: Latravious Levitt, male   DOB: 01/25/1961, 51 y.o.   MRN: [id number] BP: 121/88 Pulse: 92 Mr. Navarette is in his room.  He states he is doing well and his only complaint is of a headache but he doesn't want to take too much Tylenol.  He is quickly reassured that he won't be allowed to take too much and if he needs it, he should take it.  He declined the Ibuprofen, which was reasonable given his GI Bleed.  He reports that he is having daily BMs and reports no further bleeding at this time.  Saleem states his mood as good and denies SI.  States he is "trying to stay away from those thoughts." He didn't sleep well last night and feels drowsy today, but otherwise says he is good.  He notes he is a little anxious about going to [facility] but is looking forward to it.  No AH/VH.  Speech is clear, with good eye contact. Affect is positive. Reports minimal hand tremors for his withdrawal symptoms. A.) Detox day 3 P.) Continue current plan of care with no changes at this time.  Rona Ravens. Ravonda Brecheen PAC

## 2011-12-02 NOTE — Tx Team (Signed)
Pt observed in the dayroom watching TV with peers after evening group time.  Pt is pleasant/cooperative.   Pt has a nervous laugh with talking with staff.  He denies SI/HI.  He came off 1:1 this morning.  He still has slight tremors.  He voices no needs at this time.  He reports that he may be here through the weekend.  Safety maintained with q15 minute checks.

## 2011-12-02 NOTE — Progress Notes (Signed)
Pt has mild anxiety and tremors. He reports headache with no pain scale and refuses prn pain medication stating that it does not help. Offered support, encouragement and 15 minute checks. Pt rates depression as an 8 and hopelessness as a 6. He reports that he did not sleep well last night.   Safety maintained on unit.

## 2011-12-02 NOTE — Tx Team (Signed)
Interdisciplinary Treatment Plan Update (Adult)  Date:  11/30/12 Time Reviewed:  11:18 AM   Progress in Treatment: Attending groups: Yes Participating in groups:  Yes Taking medication as prescribed: Yes Tolerating medication:  Yes Family/Significant othe contact made:  Counselor assessing for appropriate contact Patient understands diagnosis:  Yes Discussing patient identified problems/goals with staff:  Yes Medical problems stabilized or resolved:  Yes Denies suicidal/homicidal ideation: Yes Issues/concerns per patient self-inventory:  None identified Other: N/A  New problem(s) identified: None Identified  Reason for Continuation of Hospitalization:  Depression Anxiety Medication Stabilization  Interventions implemented related to continuation of hospitalization: mood stabilization, medication monitoring and adjustment, group therapy and psycho education, safety checks q 15 mins  Additional comments: Pt taken off 1:1 today  Estimated length of stay: 3-5 days  Discharge Plan: Pt will follow up at [facility] on Monday for further SA treatment  New goal(s): N/A  Review of initial/current patient goals per problem list:    1.  Goal(s): Address substance use  Met:  No  Target date: by discharge  As evidenced by: completing detox protocol and referring to appropriate treatment  2.  Goal (s): Reduce depressive symptoms  Met:  No  Target date: by discharge  As evidenced by: Reducing depression from a 10 to a 3 as reported by pt.    3.  Goal(s): Reduce anxiety symptoms  Met:  No  Target date: by discharge  As evidenced by: Reducing anxiety from a 10 to a 3 as reported by pt.     Attendees: Patient:  Terry Bradley 12/01/11 11:20 AM   Family:     Physician:  Lupe Carney, DO 12/01/11 11:18 AM   Nursing: Carolynn Comment, RN 12/01/11 11:18 AM   Case Manager:   12/01/11  11:18 AM   Counselor:  Ronda Fairly, LCSWA 12/01/11  11:18 AM   Other:   Neill Loft, RN 12/01/11  11:18 AM   Other:  Tanya Nones, SW intern 12/01/11 11:23 AM   Other:  Wilmon Arms, SW intern 12/01/11 11:23 AM   Other:      Scribe for Treatment Team:   Tanya Nones, SW intern  12/01/11 11:18 AM

## 2011-12-02 NOTE — Progress Notes (Signed)
12/02/2011         Time: 1415      Group Topic/Focus: The focus of this group is on enhancing patients' problem solving skills, which involves identifying the problem, brainstorming solutions and choosing and trying a solution.   Participation Level: Active  Participation Quality: Attentive  Affect: Appropriate  Cognitive: Oriented  Additional Comments: None.    Vikkie Goeden 12/02/2011 3:55 PM 

## 2011-12-02 NOTE — Progress Notes (Signed)
[facility] Group Notes:  (Counselor/Nursing/MHT/Case Management/Adjunct)  12/02/2011 2:20 PM   Type of Therapy:  Processing Group at 11:00 am  Participation Level:  Active  Participation Quality:  Appropriate  Affect:  Appropriate  Cognitive:  Oriented and alert  Insight:  Improving  Engagement in Group:  Good  Engagement in Therapy:  Good  Modes of Intervention:  Exploration and activity  Summary of Progress/Problems:  Terry Bradley shared feelings of contentment related to a balanced life in opposition to the chaotic nature of his life do to his use of alcohol; "alcohols taken off of that stuff away from not life." Patient was attentive to others in group more so than usual.   [facility] Group Notes:  (Counselor/Nursing/MHT/Case Management/Adjunct)  12/02/2011 2:20 PM   Type of Therapy:  Counseling Group at 1:15 pm  Participation Level:  Minimal  Participation Quality:  Resistant  Affect:  Drowsy  Cognitive:  Oriented  Insight:  Unknown  Engagement in Group:  Minimal  Engagement in Therapy:  Minimal  Modes of Intervention:  Exploration, and discussion and meditation  Summary of Progress/Problems:  Patient chose not to participate in discussion on self-care and appeared to doze during meditation exercise.  Ronda Fairly, LCSWA 12/02/2011 2:20 PM

## 2011-12-02 NOTE — Progress Notes (Signed)
Pt attended discharge planning group and actively participated.  Pt presents with flat affect and depressed mood.  Pt ranks depression and anxiety at an 8 today.  Pt denies SI.  Pt is still eager to go to [facility] on Monday.  Pt provided with a bus pass to get there on Monday.  Pt reports poor sleep last night.  No further needs at this time.   Reyes Ivan, LCSWA 12/02/2011  9:53 AM

## 2011-12-03 MED ORDER — TRAZODONE HCL 50 MG PO TABS
50.0000 mg | ORAL_TABLET | Freq: Every day | ORAL | Status: DC
  Administered 2011-12-03: 50 mg via ORAL
  Filled 2011-12-03 (×3): qty 1

## 2011-12-03 NOTE — Progress Notes (Signed)
Has sleep at long intervals during night.  Q 15 min safety checks are being conducted.

## 2011-12-03 NOTE — Progress Notes (Signed)
Pt attended discharge planning group and actively participated.  Pt presents with calm mood and affect.  Pt ranks depression and anxiety at a 6 today.  Pt denies SI.  Pt reports better sleep last night, stating he got a double dose of sleep meds.  Pt reports feeling ready to d/c on Monday to [facility].  SW reviewed with pt on how to get there and to meet [facility] staff at the [place] bus stop for further transportation.  No further needs at this time.  Pt stable to d/c on Monday for treatment.  Safety planning and suicide prevention discussed.     Terry Bradley, LCSWA 12/03/2011  9:30 AM

## 2011-12-03 NOTE — Progress Notes (Signed)
[facility] MD Progress Note  12/03/2011 11:27 AM  S/O: Patient seen and evaluated. Chart reviewed. At this time, patient stated that his mood was "better". His affect was mood congruent and euthymic. He denied any current thoughts of self injurious behavior, suicidal ideation or homicidal ideation. There were no auditory or visual hallucinations, paranoia, delusional thought processes, or mania noted. Thought process was linear and goal directed. No psychomotor agitation or retardation was noted. His speech was normal rate, tone and volume. Eye contact was good. Judgment and insight are fair. Patient has been up and engaged on the unit. No acute safety concerns reported from team.  C/o decreased sleep.  Sleep:  Number of Hours: 5    Vital Signs:Blood pressure 128/86, pulse 96, temperature 97.4 F (36.3 C), temperature source Oral, resp. rate 18, height 5' 5.75" (1.67 m), weight 65.318 kg (144 lb), SpO2 99.00%.  Current Medications:   . citalopram  20 mg Oral Daily  . mulitivitamin with minerals  1 tablet Oral Daily  . thiamine  100 mg Oral Daily    Lab Results: No results found for this or any previous visit (from the past 48 hour(s)).  Physical Findings: CIWA:  CIWA-Ar Total: 3   A/P:  Alcohol Dependence; Depressive disorder NOS; Tobacco Dependence   Treatment goals and medication management reviewed with patient.  Continue current treatment plan and medications with addition of Trazodone for sleep.  No SEs reported at current dosages.  Pt being discharged directly to [facility] on Monday.  Pt agreeable with plan.  Discussed with team.  Lupe Carney 12/03/2011, 11:27 AM

## 2011-12-03 NOTE — Progress Notes (Signed)
[facility] Group Notes:  (Counselor/Nursing/MHT/Case Management/Adjunct)  12/03/2011 5:34 PM  Type of Therapy:  Group Therapy  Participation Level:  Minimal  Participation Quality:  Attentive  Affect:  Appropriate  Cognitive:  Alert and Oriented  Insight:  Limited  Engagement in Group:  Good  Engagement in Therapy:  Limited  Modes of Intervention:  Education, Socialization and Support  Summary of Progress/Problems:  Terry Bradley was attentive yet quiet during educational portion of group discussion. When his improved appearance was noted patient basked in the attention.   Clide Dales 12/03/2011, 5:34 PM

## 2011-12-03 NOTE — Progress Notes (Signed)
[facility] Group Notes:  (Counselor/Nursing/MHT/Case Management/Adjunct)  12/03/2011 1:00 PM  Type of Therapy:  Group Therapy  Participation Level:  None  Participation Quality:  Inattentive  Affect:  Flat and Drowsy  Cognitive:  Unable to Assess  Insight:  None  Engagement in Group:  None  Engagement in Therapy:  None  Modes of Intervention:  Education, Support and Exploration  Summary of Progress/Problems: Patient did not actively engage in group discussion.   Wilmon Arms 12/03/2011, 1:00 PM

## 2011-12-04 MED ORDER — GABAPENTIN 100 MG PO CAPS
200.0000 mg | ORAL_CAPSULE | Freq: Three times a day (TID) | ORAL | Status: DC
  Administered 2011-12-04 – 2011-12-05 (×3): 200 mg via ORAL
  Filled 2011-12-04 (×6): qty 2

## 2011-12-04 MED ORDER — TRAZODONE HCL 100 MG PO TABS
100.0000 mg | ORAL_TABLET | Freq: Every day | ORAL | Status: DC
  Administered 2011-12-04: 100 mg via ORAL
  Filled 2011-12-04 (×2): qty 1

## 2011-12-04 NOTE — Progress Notes (Signed)
Patient ID: [name], male   DOB: 07-10-1961, 51 y.o.   MRN: [id number] 12/04/2011 Laszlo is doing well...handling his deression and learning to understand his disease. HE is cooperative and pleasant. HE takes his meds as ordered and he completed his self inventory and on it he wrote that he denied SI, that he rated his depression " 4/5" and that his DC plan includes " no alcohol". R Safety is maintained and POC includes fostering therpaeutic relationship PD RN [facility]

## 2011-12-04 NOTE — Progress Notes (Signed)
Pt. Requesting quietly.  NO complaints of pain or discomfort noted .

## 2011-12-04 NOTE — Progress Notes (Signed)
[facility] Group Notes:  (Counselor/Nursing/MHT/Case Management/Adjunct)  12/04/2011 1:15 PM  Type of Therapy:  Group Therapy, Dance/Movement Therapy   Participation Level:  Active  Participation Quality:  Attentive, Drowsy and Sharing  Affect:  Appropriate  Cognitive:  Oriented  Insight:  Limited  Engagement in Group:  Limited  Engagement in Therapy:  Good  Modes of Intervention:  Clarification, Problem-solving, Role-play, Socialization and Support  Summary of Progress/Problems: pt participated in a group discussion on what is needed to have lasting recovery such as identifying needs, safety planning and having self confidence. Pt embodied positive copings skills and spoke about how negative ones led to self sabotage. Terry Bradley  
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
## 2011-12-05 DIAGNOSIS — T50904A Poisoning by unspecified drugs, medicaments and biological substances, undetermined, initial encounter: Secondary | ICD-10-CM

## 2011-12-05 DIAGNOSIS — F1994 Other psychoactive substance use, unspecified with psychoactive substance-induced mood disorder: Secondary | ICD-10-CM

## 2011-12-05 DIAGNOSIS — F1020 Alcohol dependence, uncomplicated: Principal | ICD-10-CM

## 2011-12-05 MED ORDER — GABAPENTIN 300 MG PO CAPS
300.0000 mg | ORAL_CAPSULE | Freq: Three times a day (TID) | ORAL | Status: DC
  Administered 2011-12-05 (×3): 300 mg via ORAL
  Filled 2011-12-05 (×3): qty 12
  Filled 2011-12-05 (×2): qty 1
  Filled 2011-12-05: qty 12
  Filled 2011-12-05 (×2): qty 1

## 2011-12-05 MED ORDER — GABAPENTIN 300 MG PO CAPS: 300.0000 mg | ORAL_CAPSULE | Freq: Three times a day (TID) | ORAL | Status: DC

## 2011-12-05 MED ORDER — TRAZODONE HCL 150 MG PO TABS
150.0000 mg | ORAL_TABLET | Freq: Every day | ORAL | Status: DC
  Administered 2011-12-05: 150 mg via ORAL
  Filled 2011-12-05: qty 3
  Filled 2011-12-05: qty 1

## 2011-12-05 MED ORDER — CITALOPRAM HYDROBROMIDE 20 MG PO TABS: 20.0000 mg | ORAL_TABLET | Freq: Every day | ORAL | Status: DC

## 2011-12-05 MED ORDER — TRAZODONE HCL 150 MG PO TABS: 150.0000 mg | ORAL_TABLET | Freq: Every day | ORAL | Status: DC
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
## 2011-12-05 NOTE — [facility] Suicide Risk Assessment (Signed)
Suicide Risk Assessment  Discharge Assessment     Demographic factors:  Assessment Details Time of Assessment: Admission Information Obtained From: Patient Current Mental Status:  AO x 3. Risk Reduction Factors:     CLINICAL FACTORS:   Depression:   Anhedonia Alcohol/Substance Abuse/Dependencies Previous Psychiatric Diagnoses and Treatments  COGNITIVE FEATURES THAT CONTRIBUTE TO RISK:  None Noted.   Diagnosis:  Axis I: Alcohol Dependence. Substance Induced Mood Disorder.   The patient was seen today and reports the following:   ADL's: Intact.  Sleep: The patient reports to sleeping "fair" last night.  Appetite: The patient reports a good appetite.   Mild>(1-10) >Severe  Hopelessness (1-10): 0  Depression (1-10): 2-3  Anxiety (1-10): 2-3   Suicidal Ideation: The patient adamantly denies any suicidal ideations today.  Plan: No  Intent: No  Means: No   Homicidal Ideation: The patient adamantly denies any homicidal ideations.  Plan: No  Intent: No.  Means: No   General Appearance /Behavior: Casual and cooperative.  Eye Contact: Good.  Speech: Appropriate in rate and volume with no pressuring noted.  Motor Behavior: Appropriate.  Level of Consciousness: Alert and Oriented x 3.  Mental Status: Alert and Oriented x 3.  Mood: Mildly Depressed.  Affect: Mildly Constricted.  Anxiety Level: Mild Anxiety reported.  Thought Process: wnl today.  Thought Content: The patient denies any auditory or visual hallucinations or delusional thinking today.  Perception:. wnl.  Judgment: Good.  Insight: Good.  Cognition: Oriented to time, place and person.   Time was spent today discussing with the patient his plans for discharge.  Mr. Hara states that he will be leaving [facility] early in the morning to travel to [facility] for further treatment of his substance abuse issues.  He reports that he is appreciative of his care at [facility] and is looking forward to receiving longer term  treatment.  He denies any substance related withdrawal symptoms or any other concerns today.  Treatment Plan Summary:  1. Daily contact with patient to assess and evaluate symptoms and progress in treatment  2. Medication management  3. The patient will deny suicidal ideations or homicidal ideations for 48 hours prior to discharge and have a depression and anxiety rating of 3 or less. The patient will also deny any auditory or visual hallucinations or delusional thinking.  4. The patient will deny any symptoms of substance withdrawal at time of discharge.  Plan:  1. Will continue current medications. 2. Will continue to monitor. 3. Discharge early in the morning to travel to [facility] for further treatment of his substance abuse issues.  SUICIDE RISK:   Minimal: No identifiable suicidal ideation.  Patients presenting with no risk factors but with morbid ruminations; may be classified as minimal risk based on the severity of the depressive symptoms  Lamin Chandley 12/05/2011, 5:13 PM
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
## 2011-12-05 NOTE — Progress Notes (Signed)
Patient ID: Terry Bradley, male   DOB: 02/18/1961, 51 y.o.   MRN: [id number]  [facility] Group Notes:  (Counselor/Nursing/MHT/Case Management/Adjunct)  12/05/2011 1:15 PM  Type of Therapy:  Group Therapy, Dance/Movement Therapy   Participation Level:  Active  Participation Quality:  Appropriate  Affect:  Appropriate  Cognitive:  Alert  Insight:  Good  Engagement in Group:  Good  Engagement in Therapy:  Good  Modes of Intervention:  Clarification, Problem-solving, Role-play, Socialization and Support  Summary of Progress/Problems:  Therapist discussed with group what happens when a plan doesn't go as expected and  ways to develop positive coping mechanisms when support systems have been exhausted in order to remain focused.  Pt. stated that he will " keep my mind focused on the bigger picture ".        Rhunette Croft

## 2011-12-05 NOTE — Progress Notes (Signed)
Patient ID: Terry Bradley, male   DOB: [birth year], 51 y.o.   MRN: [id number] 12/05/2011  Nursing  [address] is seen out in the milieu...interacting with his peers and the staff appropriately today. HE is compliant with his meds. HE completed his self inventory and on it he wrote  He denied SI, he rated his depression and hopelessnesss " 4 / 1 " and stated his DC plan was to eliminate alcohol from his life. A HE attends his groups, is engaged in his POC. R Safety is maintained and DC plan includes DC tomorrow and pt scehduled to go to [facility], for longterm F/U. PD RN [facility]

## 2011-12-06 NOTE — Progress Notes (Addendum)
Patient ID: Terry Bradley, male   DOB: 07/24/1961, 51 y.o.   MRN: [id number] Pt has slept at intervals. Ambulatory this am, showered, denies SI/HI/AVH.  Meds reviewed and given as well as follow-up instructions and prescriptions., also bus pass and breakfast.  Going to [employer] per bus.  No c/o's voiced on discharge.

## 2011-12-06 NOTE — Progress Notes (Signed)
Patient ID: Terry Bradley, male   DOB: 1961/02/28, 51 y.o.   MRN: [id number] Has been pleasant and cooperative this evening, attended group, voiced plans to be discharged in am.States feels much better, feels positive about stay here, feels ready for next step , going to [facility] in am. Denuies SI, HI AVH.  Will continue to monitor.

## 2011-12-10 NOTE — Progress Notes (Signed)
Patient Discharge Instructions:  Admission Note Faxed,  12/10/2011 After Visit Summary Faxed,  12/10/2011 Faxed to the Next Level Care provider:  12/10/2011 Facesheet faxed 12/10/2011  Faxed to [facility] @ [phone]  Wandra Scot, 12/10/2011, 5:36 PM
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
## 2012-10-06 ENCOUNTER — Encounter (HOSPITAL_COMMUNITY): Payer: Self-pay | Admitting: *Deleted

## 2012-10-06 ENCOUNTER — Emergency Department (HOSPITAL_COMMUNITY): Payer: Self-pay

## 2012-10-06 ENCOUNTER — Inpatient Hospital Stay (HOSPITAL_COMMUNITY)
Admission: EM | Admit: 2012-10-06 | Discharge: 2012-10-10 | DRG: 394 | Disposition: A | Discharge status: Home / Self Care | Payer: MEDICAID | Attending: Internal Medicine | Admitting: Internal Medicine

## 2012-10-06 DIAGNOSIS — D649 Anemia, unspecified: Secondary | ICD-10-CM

## 2012-10-06 DIAGNOSIS — K625 Hemorrhage of anus and rectum: Secondary | ICD-10-CM | POA: Diagnosis present

## 2012-10-06 DIAGNOSIS — I10 Essential (primary) hypertension: Secondary | ICD-10-CM | POA: Diagnosis present

## 2012-10-06 DIAGNOSIS — K648 Other hemorrhoids: Principal | ICD-10-CM | POA: Diagnosis present

## 2012-10-06 DIAGNOSIS — R079 Chest pain, unspecified: Secondary | ICD-10-CM | POA: Diagnosis present

## 2012-10-06 DIAGNOSIS — F1020 Alcohol dependence, uncomplicated: Secondary | ICD-10-CM | POA: Diagnosis present

## 2012-10-06 DIAGNOSIS — R55 Syncope and collapse: Secondary | ICD-10-CM | POA: Diagnosis present

## 2012-10-06 DIAGNOSIS — D62 Acute posthemorrhagic anemia: Secondary | ICD-10-CM | POA: Diagnosis present

## 2012-10-06 DIAGNOSIS — J449 Chronic obstructive pulmonary disease, unspecified: Secondary | ICD-10-CM | POA: Diagnosis present

## 2012-10-06 DIAGNOSIS — K219 Gastro-esophageal reflux disease without esophagitis: Secondary | ICD-10-CM | POA: Diagnosis present

## 2012-10-06 DIAGNOSIS — Z5900 Homelessness unspecified: Secondary | ICD-10-CM

## 2012-10-06 DIAGNOSIS — F10929 Alcohol use, unspecified with intoxication, unspecified: Secondary | ICD-10-CM

## 2012-10-06 DIAGNOSIS — J4489 Other specified chronic obstructive pulmonary disease: Secondary | ICD-10-CM | POA: Diagnosis present

## 2012-10-06 DIAGNOSIS — K623 Rectal prolapse: Secondary | ICD-10-CM | POA: Diagnosis present

## 2012-10-06 DIAGNOSIS — F17200 Nicotine dependence, unspecified, uncomplicated: Secondary | ICD-10-CM | POA: Diagnosis present

## 2012-10-06 DIAGNOSIS — R918 Other nonspecific abnormal finding of lung field: Secondary | ICD-10-CM | POA: Diagnosis present

## 2012-10-06 DIAGNOSIS — K922 Gastrointestinal hemorrhage, unspecified: Secondary | ICD-10-CM | POA: Diagnosis present

## 2012-10-06 DIAGNOSIS — F1010 Alcohol abuse, uncomplicated: Secondary | ICD-10-CM

## 2012-10-06 DIAGNOSIS — K649 Unspecified hemorrhoids: Secondary | ICD-10-CM | POA: Diagnosis present

## 2012-10-06 LAB — RAPID URINE DRUG SCREEN, HOSP PERFORMED
Barbiturates: NOT DETECTED
Benzodiazepines: NOT DETECTED
Cocaine: NOT DETECTED

## 2012-10-06 LAB — URINE MICROSCOPIC-ADD ON

## 2012-10-06 LAB — URINALYSIS, ROUTINE W REFLEX MICROSCOPIC
Bilirubin Urine: NEGATIVE
Glucose, UA: NEGATIVE mg/dL
Leukocytes, UA: NEGATIVE
Nitrite: NEGATIVE
Specific Gravity, Urine: 1.009 (ref 1.005–1.030)
pH: 6.5 (ref 5.0–8.0)

## 2012-10-06 LAB — COMPREHENSIVE METABOLIC PANEL
ALT: 32 U/L (ref 0–53)
AST: 70 U/L — ABNORMAL HIGH (ref 0–37)
CO2: 25 mEq/L (ref 19–32)
Chloride: 100 mEq/L (ref 96–112)
Creatinine, Ser: 0.61 mg/dL (ref 0.50–1.35)
GFR calc Af Amer: >90 mL/min (ref >90)
GFR calc non Af Amer: >90 mL/min (ref >90)
Glucose, Bld: 90 mg/dL (ref 70–99)
Total Bilirubin: 0.1 mg/dL — ABNORMAL LOW (ref 0.3–1.2)

## 2012-10-06 LAB — CBC WITH DIFFERENTIAL/PLATELET
Basophils Absolute: 0.1 10*3/uL (ref 0.0–0.1)
Eosinophils Absolute: 0.1 10*3/uL (ref 0.0–0.7)
HCT: 35.4 % — ABNORMAL LOW (ref 39.0–52.0)
Lymphs Abs: 2.7 10*3/uL (ref 0.7–4.0)
MCHC: 33.1 g/dL (ref 30.0–36.0)
MCV: 77.3 fL — ABNORMAL LOW (ref 78.0–100.0)
Neutro Abs: 2.3 10*3/uL (ref 1.7–7.7)
RDW: 21.6 % — ABNORMAL HIGH (ref 11.5–15.5)

## 2012-10-06 LAB — ETHANOL: Alcohol, Ethyl (B): 418 mg/dL (ref 0–11)

## 2012-10-06 LAB — LACTIC ACID, PLASMA: Lactic Acid, Venous: 1.7 mmol/L (ref 0.5–2.2)

## 2012-10-06 LAB — OCCULT BLOOD, POC DEVICE: Fecal Occult Bld: POSITIVE — AB

## 2012-10-06 MED ORDER — SODIUM CHLORIDE 0.9 % IV SOLN: Freq: Once | INTRAVENOUS | Status: DC

## 2012-10-06 MED ORDER — IOHEXOL 300 MG/ML  SOLN
80.0000 mL | Freq: Once | INTRAMUSCULAR | Status: AC | PRN
  Administered 2012-10-06: 80 mL via INTRAVENOUS

## 2012-10-06 MED ORDER — SODIUM CHLORIDE 0.9 % IV SOLN
Freq: Once | INTRAVENOUS | Status: AC
  Administered 2012-10-06: 22:00:00 via INTRAVENOUS

## 2012-10-06 MED ORDER — HYDROCORTISONE 2.5 % RE CREA
TOPICAL_CREAM | Freq: Once | RECTAL | Status: AC
  Administered 2012-10-06: 1 via RECTAL
  Filled 2012-10-06: qty 28.35

## 2012-10-06 NOTE — ED Notes (Signed)
Pt c/o on-going rectal bleeding x 3 months approximately. Pt states worsening bleeding over past 7 days and increased abdominal  pain. Pt is homeless w/ hx of ETOH abuse.
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
## 2012-10-07 ENCOUNTER — Inpatient Hospital Stay (HOSPITAL_COMMUNITY): Payer: Self-pay

## 2012-10-07 ENCOUNTER — Encounter (HOSPITAL_COMMUNITY): Payer: Self-pay | Admitting: *Deleted

## 2012-10-07 DIAGNOSIS — F1020 Alcohol dependence, uncomplicated: Secondary | ICD-10-CM

## 2012-10-07 DIAGNOSIS — R079 Chest pain, unspecified: Secondary | ICD-10-CM | POA: Diagnosis present

## 2012-10-07 DIAGNOSIS — R55 Syncope and collapse: Secondary | ICD-10-CM | POA: Diagnosis present

## 2012-10-07 DIAGNOSIS — K625 Hemorrhage of anus and rectum: Secondary | ICD-10-CM

## 2012-10-07 DIAGNOSIS — D62 Acute posthemorrhagic anemia: Secondary | ICD-10-CM

## 2012-10-07 LAB — CBC
HCT: 31.4 % — ABNORMAL LOW (ref 39.0–52.0)
HCT: 32.7 % — ABNORMAL LOW (ref 39.0–52.0)
MCH: 26.5 pg (ref 26.0–34.0)
MCHC: 33.1 g/dL (ref 30.0–36.0)
MCHC: 34 g/dL (ref 30.0–36.0)
MCHC: 33 g/dL (ref 30.0–36.0)
MCV: 78.7 fL (ref 78.0–100.0)
MCV: 77.9 fL — ABNORMAL LOW (ref 78.0–100.0)
Platelets: 216 10*3/uL (ref 150–400)
Platelets: 227 10*3/uL (ref 150–400)
Platelets: 214 10*3/uL (ref 150–400)
RDW: 21.7 % — ABNORMAL HIGH (ref 11.5–15.5)
RDW: 21.3 % — ABNORMAL HIGH (ref 11.5–15.5)
RDW: 21.4 % — ABNORMAL HIGH (ref 11.5–15.5)
WBC: 14.9 10*3/uL — ABNORMAL HIGH (ref 4.0–10.5)

## 2012-10-07 LAB — BASIC METABOLIC PANEL
BUN: 7 mg/dL (ref 6–23)
Creatinine, Ser: 0.57 mg/dL (ref 0.50–1.35)
GFR calc Af Amer: >90 mL/min (ref >90)
GFR calc non Af Amer: >90 mL/min (ref >90)
Glucose, Bld: 114 mg/dL — ABNORMAL HIGH (ref 70–99)

## 2012-10-07 LAB — HIV ANTIBODY (ROUTINE TESTING W REFLEX): HIV: NONREACTIVE

## 2012-10-07 LAB — PROTIME-INR
INR: 0.85 (ref 0.00–1.49)
Prothrombin Time: 11.6 seconds (ref 11.6–15.2)

## 2012-10-07 LAB — SAMPLE TO BLOOD BANK

## 2012-10-07 MED ORDER — THIAMINE HCL 100 MG/ML IJ SOLN
100.0000 mg | Freq: Every day | INTRAMUSCULAR | Status: DC
  Filled 2012-10-07 (×4): qty 1

## 2012-10-07 MED ORDER — ACETAMINOPHEN 650 MG RE SUPP: 650.0000 mg | Freq: Four times a day (QID) | RECTAL | Status: DC | PRN

## 2012-10-07 MED ORDER — LORAZEPAM 1 MG PO TABS
1.0000 mg | ORAL_TABLET | Freq: Four times a day (QID) | ORAL | Status: AC | PRN
  Administered 2012-10-07 – 2012-10-09 (×3): 1 mg via ORAL
  Filled 2012-10-07 (×4): qty 1

## 2012-10-07 MED ORDER — LORAZEPAM 2 MG/ML IJ SOLN
1.0000 mg | Freq: Four times a day (QID) | INTRAMUSCULAR | Status: DC | PRN
  Administered 2012-10-07: 1 mg via INTRAVENOUS
  Filled 2012-10-07: qty 1

## 2012-10-07 MED ORDER — ADULT MULTIVITAMIN W/MINERALS CH
1.0000 | ORAL_TABLET | Freq: Every day | ORAL | Status: DC
  Administered 2012-10-07 – 2012-10-10 (×4): 1 via ORAL
  Filled 2012-10-07 (×4): qty 1

## 2012-10-07 MED ORDER — LORAZEPAM 1 MG PO TABS: 1.0000 mg | ORAL_TABLET | Freq: Four times a day (QID) | ORAL | Status: DC | PRN

## 2012-10-07 MED ORDER — SODIUM CHLORIDE 0.9 % IJ SOLN
3.0000 mL | Freq: Two times a day (BID) | INTRAMUSCULAR | Status: DC
  Administered 2012-10-07 (×3): 3 mL via INTRAVENOUS

## 2012-10-07 MED ORDER — HYDROCODONE-ACETAMINOPHEN 5-325 MG PO TABS
1.0000 | ORAL_TABLET | ORAL | Status: DC | PRN
  Administered 2012-10-08 – 2012-10-10 (×7): 2 via ORAL
  Filled 2012-10-07 (×7): qty 2

## 2012-10-07 MED ORDER — VITAMIN B-1 100 MG PO TABS
100.0000 mg | ORAL_TABLET | Freq: Every day | ORAL | Status: DC
  Administered 2012-10-07 – 2012-10-10 (×4): 100 mg via ORAL
  Filled 2012-10-07 (×4): qty 1

## 2012-10-07 MED ORDER — LORAZEPAM 2 MG/ML IJ SOLN
1.0000 mg | Freq: Four times a day (QID) | INTRAMUSCULAR | Status: AC | PRN
  Administered 2012-10-08: 1 mg via INTRAVENOUS
  Filled 2012-10-07: qty 1

## 2012-10-07 MED ORDER — IOHEXOL 300 MG/ML  SOLN
80.0000 mL | Freq: Once | INTRAMUSCULAR | Status: AC | PRN
  Administered 2012-10-07: 80 mL via INTRAVENOUS

## 2012-10-07 MED ORDER — PANTOPRAZOLE SODIUM 40 MG PO TBEC
40.0000 mg | DELAYED_RELEASE_TABLET | Freq: Every day | ORAL | Status: DC
  Administered 2012-10-07 – 2012-10-09 (×3): 40 mg via ORAL
  Filled 2012-10-07 (×3): qty 1

## 2012-10-07 MED ORDER — FOLIC ACID 1 MG PO TABS
1.0000 mg | ORAL_TABLET | Freq: Every day | ORAL | Status: DC
  Administered 2012-10-07 – 2012-10-10 (×4): 1 mg via ORAL
  Filled 2012-10-07 (×4): qty 1

## 2012-10-07 MED ORDER — LORAZEPAM 1 MG PO TABS
0.0000 mg | ORAL_TABLET | Freq: Four times a day (QID) | ORAL | Status: AC
  Administered 2012-10-07 – 2012-10-08 (×4): 1 mg via ORAL
  Filled 2012-10-07 (×4): qty 1

## 2012-10-07 MED ORDER — HYDROCORTISONE 2.5 % RE CREA
TOPICAL_CREAM | Freq: Four times a day (QID) | RECTAL | Status: DC
  Administered 2012-10-07: 1 via RECTAL
  Administered 2012-10-07 – 2012-10-10 (×9): via RECTAL
  Filled 2012-10-07: qty 28.35

## 2012-10-07 MED ORDER — ACETAMINOPHEN 325 MG PO TABS: 650.0000 mg | ORAL_TABLET | Freq: Four times a day (QID) | ORAL | Status: DC | PRN

## 2012-10-07 MED ORDER — SODIUM CHLORIDE 0.9 % IV BOLUS (SEPSIS)
500.0000 mL | Freq: Once | INTRAVENOUS | Status: AC
  Administered 2012-10-07: 500 mL via INTRAVENOUS

## 2012-10-07 MED ORDER — LABETALOL HCL 5 MG/ML IV SOLN
10.0000 mg | Freq: Once | INTRAVENOUS | Status: AC
  Administered 2012-10-07: 10 mg via INTRAVENOUS
  Filled 2012-10-07: qty 4

## 2012-10-07 MED ORDER — LORAZEPAM 1 MG PO TABS
0.0000 mg | ORAL_TABLET | Freq: Two times a day (BID) | ORAL | Status: DC
  Administered 2012-10-09: 1 mg via ORAL

## 2012-10-07 MED ORDER — HYDROCORTISONE ACETATE 25 MG RE SUPP
25.0000 mg | Freq: Two times a day (BID) | RECTAL | Status: DC
  Administered 2012-10-07 – 2012-10-10 (×7): 25 mg via RECTAL
  Filled 2012-10-07 (×8): qty 1
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
## 2012-10-07 NOTE — ED Provider Notes (Signed)
2:23 AM cbg 91. Alert and oriented. SBP 112  The patient is being transported to his room when he became lightheaded and nauseated.  His syncopal episode in the hallway while in the wheelchair.  He did not fall out of the wheelchair.  He was brought immediately back to his room and laid flat.  He had no seizure-like activity.  He did urinate on himself.  He returned quickly to baseline mental status his blood sugar and blood pressure were normal.  EKG will be obtained.  Repeat CBC to check his hemoglobin.  The admitting team will be contacted.  He likely should be changed to a telemetry bed.   Date: 10/07/2012  Rate: 67  Rhythm: normal sinus rhythm  QRS Axis: normal  Intervals: normal  ST/T Wave abnormalities: normal  Conduction Disutrbances: none  Narrative Interpretation:   Old EKG Reviewed: No significant changes noted     Lyanne Co, MD 10/07/12 [phone]

## 2012-10-08 LAB — CBC
HCT: 34.2 % — ABNORMAL LOW (ref 39.0–52.0)
MCH: 26.3 pg (ref 26.0–34.0)
MCHC: 33.3 g/dL (ref 30.0–36.0)
RDW: 21.4 % — ABNORMAL HIGH (ref 11.5–15.5)

## 2012-10-08 LAB — IRON AND TIBC
Saturation Ratios: 51 % (ref 20–55)
UIBC: 166 ug/dL (ref 125–400)

## 2012-10-08 LAB — BASIC METABOLIC PANEL
BUN: 5 mg/dL — ABNORMAL LOW (ref 6–23)
Creatinine, Ser: 0.53 mg/dL (ref 0.50–1.35)
GFR calc Af Amer: >90 mL/min (ref >90)
GFR calc non Af Amer: >90 mL/min (ref >90)
Glucose, Bld: 80 mg/dL (ref 70–99)

## 2012-10-08 LAB — RETICULOCYTES
RBC.: 4.33 MIL/uL (ref 4.22–5.81)
Retic Count, Absolute: 26 10*3/uL (ref 19.0–186.0)
Retic Ct Pct: 0.6 % (ref 0.4–3.1)

## 2012-10-08 LAB — VITAMIN B12: Vitamin B-12: 502 pg/mL (ref 211–911)

## 2012-10-08 LAB — FOLATE: Folate: 14.2 ng/mL

## 2012-10-08 MED ORDER — LEVOFLOXACIN 750 MG PO TABS
750.0000 mg | ORAL_TABLET | Freq: Every day | ORAL | Status: DC
  Administered 2012-10-08 – 2012-10-10 (×3): 750 mg via ORAL
  Filled 2012-10-08 (×3): qty 1

## 2012-10-08 MED ORDER — SODIUM CHLORIDE 0.9 % IV SOLN
INTRAVENOUS | Status: DC
  Administered 2012-10-08 (×2): via INTRAVENOUS
  Administered 2012-10-09: 75 mL/h via INTRAVENOUS

## 2012-10-08 MED ORDER — LISINOPRIL 20 MG PO TABS
20.0000 mg | ORAL_TABLET | Freq: Every day | ORAL | Status: DC
  Administered 2012-10-08 – 2012-10-10 (×3): 20 mg via ORAL
  Filled 2012-10-08 (×3): qty 1

## 2012-10-08 MED ORDER — HYDRALAZINE HCL 20 MG/ML IJ SOLN
10.0000 mg | Freq: Four times a day (QID) | INTRAMUSCULAR | Status: DC | PRN
  Administered 2012-10-08 – 2012-10-09 (×2): 10 mg via INTRAVENOUS
  Filled 2012-10-08 (×2): qty 1

## 2012-10-08 NOTE — Progress Notes (Signed)
Subjective: Cross cover [facility] Since I last evaluated the patient, he seems to be doing much better from a GI standpoint. His rectal bleeding has resolved. He denies any other GI complaints at this time.   Objective: Vital signs in last 24 hours: Temp:  [97.8 F (36.6 C)-98.4 F (36.9 C)] 97.8 F (36.6 C) (12/22 1504) Pulse Rate:  [61-85] 67  (12/22 1622) Resp:  [18-20] 18  (12/22 1504) BP: (133-190)/(90-113) 147/97 mmHg (12/22 1622) SpO2:  [98 %-100 %] 98 % (12/22 1504) Last BM Date: 10/07/12  Intake/Output from previous day: 12/21 0701 - 12/22 0700 In: 240 [P.O.:240] Out: 2450 [Urine:2450] Intake/Output this shift: Total I/O In: 240 [P.O.:240] Out: 1800 [Urine:1800]  General appearance: alert, cooperative, appears older than stated age and no distress Resp: clear to auscultation bilaterally Cardio: regular rate and rhythm, S1, S2 normal, no murmur, click, rub or gallop GI: soft, non-tender; bowel sounds normal; no masses,  no organomegaly Extremities: extremities normal, atraumatic, no cyanosis or edema  Lab Results:  [facility] 10/08/12 0525 10/07/12 1230 10/07/12 0625  WBC 6.4 14.9* 14.3*  HGB 11.4* 10.8* 10.8*  HCT 34.2* 32.7* 31.8*  PLT 204 214 227   BMET  Basename 10/08/12 0525 10/07/12 1218 10/06/12 2046  NA 133* 135 139  K 3.9 3.7 4.1  CL 98 99 100  CO2 25 25 25   GLUCOSE 80 114* 90  BUN 5* 7 10  CREATININE 0.53 0.57 0.61  CALCIUM 9.1 8.9 9.0   LFT  Basename 10/06/12 2046  PROT 8.7*  ALBUMIN 3.9  AST 70*  ALT 32  ALKPHOS 81  BILITOT 0.1*  BILIDIR --  IBILI --   PT/INR  Basename 10/07/12 0625  LABPROT 11.6  INR 0.85   Studies/Results: Ct Chest W Contrast  10/08/2012  *RADIOLOGY REPORT*  Clinical Data: Cough.  Tree in bud opacities identified at the left lung base on abdominopelvic imaging yesterday.  Evaluate for extent of disease.  CT CHEST WITH CONTRAST  Technique:  Multidetector CT imaging of the chest was performed following the standard  protocol during bolus administration of intravenous contrast.  Contrast: 80mL OMNIPAQUE IOHEXOL 300 MG/ML.  Comparison: Visualized lung bases on CT abdomen and pelvis yesterday.  CT chest 02/18/2010.  Findings: Emphysematous changes throughout both lungs, with apical blebs.  Hyperinflation.  Scattered ground-glass airspace opacities in the inferior left upper lobe and scattered throughout the left lower lobe. One of these opacities in the left lower lobe has a more ground-glass nodular appearance (series 4, image 36) measuring approximately 1.1 cm.   No involvement of the right lung.  No associated pleural effusions. Central airways patent with mild bronchial wall thickening.  Heart size normal.  No pericardial effusion.  Moderate to severe LAD and left circumflex coronary atherosclerosis for age.  Mild atherosclerosis involving the aortic arch.  Direct origin of the left vertebral artery from the arch.  No significant mediastinal, hilar, or axillary lymphadenopathy. Visualized thyroid gland unremarkable.  Steatosis throughout the visualized liver as noted on CT abdomen yesterday.  Small hiatal hernia.  Please see the report that examination for full details.  Bone window images unremarkable.  IMPRESSION:  1.  Scattered ground-glass opacities in the inferior left upper lobe and throughout the left lower lobe, likely inflammatory or infectious.  One of these opacities in the left lower lobe has a more ground-glass nodular appearance, however, so follow-up CT imaging after treatment (2-3 months) is suggested to confirm resolution. If the ground-glass opacities persist, further imaging recommendations  will be made at that time. 2.  COPD/emphysema. 3.  Moderate to severe LAD and left of complex coronary atherosclerosis for age. 4.  Upper abdominal imaging findings as detailed on the abdominopelvic CT examination yesterday, including hepatic steatosis and small hiatal hernia.   Original Report Authenticated By: Hulan Saas, M.D.    Ct Abdomen Pelvis W Contrast  10/06/2012  *RADIOLOGY REPORT*  Clinical Data: Rectal bleeding.  Abdominal pain.  CT ABDOMEN AND PELVIS WITH CONTRAST  Technique:  Multidetector CT imaging of the abdomen and pelvis was performed following the standard protocol during bolus administration of intravenous contrast.  Contrast: 80mL OMNIPAQUE IOHEXOL 300 MG/ML  SOLN  Comparison: 11/28/2011.  Findings: Lung Bases: Tree in bud micronodularity is present within the left lower lobe.  Findings suggestive of atypical infection including Mycobacterium avium.  Liver:  Fatty liver.  No focal mass lesion.  Focal fatty infiltration adjacent to the falciform ligament.  Spleen:  Normal.  Gallbladder:  Distended.  No calcified stones.  Common bile duct:  Normal.  Pancreas:  Normal.  Adrenal glands:  Normal.  Kidneys:  16 mm left interpolar renal cyst is simple.  2 mm calculus in the right inferior pole collecting system.  The ureters appear within normal limits.  Delayed excretion of contrast from the kidneys is normal.  Stomach:  Small hiatal hernia.  Small bowel:  Duodenum normal.  No small bowel obstruction.  No mesenteric adenopathy.  Colon:   Normal appendix.  Colon is within normal limits.  Pelvic Genitourinary:  Distended urinary bladder.  No free fluid. No adenopathy.  Bones:  No aggressive osseous lesions.  Large Schmorl's node in the right L3 vertebra with partial collapse, chronic.  Mild right hip osteoarthritis.  Vasculature: Atherosclerosis without aneurysm.  IMPRESSION: 1.  No acute abdominal abnormality. 2.  Fatty liver.  3.  Small hiatal hernia. 4.  Atherosclerosis. 5.  Tree in bud micronodularity in the left lower lobe, most commonly associated with infection, including atypical agents. 6.  Simple left renal cyst.   Original Report Authenticated By: Andreas Newport, M.D.     Medications: I have reviewed the patient's current medications.  Assessment/Plan: Rectal bleeding from prolapsed  hemorrhoids. Improved with steroid suppositories. He should probably have a colonoscopy prior to discharge. Will discuss with Dr. Melvia Heaps who will be on call tomorrow.   LOS: 2 days   Terry Bradley 10/08/2012, 4:28 PM
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
## 2012-10-09 ENCOUNTER — Encounter: Payer: Self-pay | Admitting: Gastroenterology

## 2012-10-09 ENCOUNTER — Inpatient Hospital Stay (HOSPITAL_COMMUNITY)
Admit: 2012-10-09 | Discharge: 2012-10-09 | Disposition: A | Discharge status: Home / Self Care | Payer: Self-pay | Attending: Cardiovascular Disease | Admitting: Cardiovascular Disease

## 2012-10-09 ENCOUNTER — Ambulatory Visit (HOSPITAL_COMMUNITY)
Admit: 2012-10-09 | Discharge: 2012-10-09 | Disposition: A | Discharge status: Home / Self Care | Payer: Self-pay | Attending: Cardiovascular Disease | Admitting: Cardiovascular Disease

## 2012-10-09 DIAGNOSIS — R55 Syncope and collapse: Secondary | ICD-10-CM

## 2012-10-09 LAB — CBC
Hemoglobin: 11.6 g/dL — ABNORMAL LOW (ref 13.0–17.0)
MCH: 25.8 pg — ABNORMAL LOW (ref 26.0–34.0)
MCHC: 32.3 g/dL (ref 30.0–36.0)
Platelets: 210 10*3/uL (ref 150–400)
RDW: 21.5 % — ABNORMAL HIGH (ref 11.5–15.5)

## 2012-10-09 LAB — BASIC METABOLIC PANEL
Calcium: 9.4 mg/dL (ref 8.4–10.5)
GFR calc Af Amer: >90 mL/min (ref >90)
GFR calc non Af Amer: >90 mL/min (ref >90)
Glucose, Bld: 81 mg/dL (ref 70–99)
Potassium: 3.9 mEq/L (ref 3.5–5.1)
Sodium: 134 mEq/L — ABNORMAL LOW (ref 135–145)

## 2012-10-09 MED ORDER — TECHNETIUM TC 99M SESTAMIBI GENERIC - CARDIOLITE
30.0000 | Freq: Once | INTRAVENOUS | Status: AC | PRN
  Administered 2012-10-09: 30 via INTRAVENOUS

## 2012-10-09 MED ORDER — TECHNETIUM TC 99M SESTAMIBI GENERIC - CARDIOLITE
10.0000 | Freq: Once | INTRAVENOUS | Status: AC | PRN
  Administered 2012-10-09: 10 via INTRAVENOUS

## 2012-10-09 MED ORDER — REGADENOSON 0.4 MG/5ML IV SOLN
0.4000 mg | Freq: Once | INTRAVENOUS | Status: AC
  Administered 2012-10-09: 0.4 mg via INTRAVENOUS
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
## 2012-10-09 NOTE — Consult Note (Signed)
[facility] Progress Note  Subjective:  Bleeding has stopped with the use of the steroid suppositories and cream.  Went for stress test today.  No abdominal pain.  Objective:  Vital signs in last 24 hours: Temp:  [97.7 F (36.5 C)-97.9 F (36.6 C)] 97.7 F (36.5 C) (12/23 0600) Pulse Rate:  [66-79] 79  (12/23 0600) Resp:  [18] 18  (12/22 1504) BP: (113-163)/(80-113) 123/88 mmHg (12/23 0600) SpO2:  [98 %-100 %] 100 % (12/23 0600) Last BM Date: 10/07/12 General:   Alert, Well-developed, in NAD; appears very anxious.  Appears older than stated age. Heart:  Regular rate and rhythm; no murmurs Pulm:  CTAB.  No W/R/R. Abdomen:  Soft, nontender and nondistended. Normal bowel sounds, without guarding, and without rebound.   Extremities:  Without edema. Neurologic:  Alert and  oriented x4;  grossly normal neurologically. Psych:  Alert and cooperative. Normal mood and affect.  Intake/Output from previous day: 12/22 0701 - 12/23 0700 In: 978.8 [P.O.:240; I.V.:738.8] Out: 3600 [Urine:3600]  Lab Results:  Basename 10/09/12 0451 10/08/12 0525 10/07/12 1230  WBC 6.4 6.4 14.9*  HGB 11.6* 11.4* 10.8*  HCT 35.9* 34.2* 32.7*  PLT 210 204 214   BMET  Basename 10/09/12 0451 10/08/12 0525 10/07/12 1218  NA 134* 133* 135  K 3.9 3.9 3.7  CL 101 98 99  CO2 25 25 25   GLUCOSE 81 80 114*  BUN 4* 5* 7  CREATININE 0.64 0.53 0.57  CALCIUM 9.4 9.1 8.9   LFT  Basename 10/06/12 2046  PROT 8.7*  ALBUMIN 3.9  AST 70*  ALT 32  ALKPHOS 81  BILITOT 0.1*  BILIDIR --  IBILI --   PT/INR  Basename 10/07/12 0625  LABPROT 11.6  INR 0.85   Ct Chest W Contrast  10/08/2012  *RADIOLOGY REPORT*  Clinical Data: Cough.  Tree in bud opacities identified at the left lung base on abdominopelvic imaging yesterday.  Evaluate for extent of disease.  CT CHEST WITH CONTRAST  Technique:  Multidetector CT imaging of the chest was performed following the standard protocol during bolus  administration of intravenous contrast.  Contrast: 80mL OMNIPAQUE IOHEXOL 300 MG/ML.  Comparison: Visualized lung bases on CT abdomen and pelvis yesterday.  CT chest 02/18/2010.  Findings: Emphysematous changes throughout both lungs, with apical blebs.  Hyperinflation.  Scattered ground-glass airspace opacities in the inferior left upper lobe and scattered throughout the left lower lobe. One of these opacities in the left lower lobe has a more ground-glass nodular appearance (series 4, image 36) measuring approximately 1.1 cm.   No involvement of the right lung.  No associated pleural effusions. Central airways patent with mild bronchial wall thickening.  Heart size normal.  No pericardial effusion.  Moderate to severe LAD and left circumflex coronary atherosclerosis for age.  Mild atherosclerosis involving the aortic arch.  Direct origin of the left vertebral artery from the arch.  No significant mediastinal, hilar, or axillary lymphadenopathy. Visualized thyroid gland unremarkable.  Steatosis throughout the visualized liver as noted on CT abdomen yesterday.  Small hiatal hernia.  Please see the report that examination for full details.  Bone window images unremarkable.  IMPRESSION:  1.  Scattered ground-glass opacities in the inferior left upper lobe and throughout the left lower lobe, likely inflammatory or infectious.  One of these opacities in the left lower lobe has a more ground-glass nodular appearance, however, so follow-up CT imaging after treatment (2-3 months) is suggested to confirm resolution. If the ground-glass opacities persist, further  imaging recommendations will be made at that time. 2.  COPD/emphysema. 3.  Moderate to severe LAD and left of complex coronary atherosclerosis for age. 4.  Upper abdominal imaging findings as detailed on the abdominopelvic CT examination yesterday, including hepatic steatosis and small hiatal hernia.   Original Report Authenticated By: Hulan Saas, M.D.      Assessment / Plan: -Rectal bleeding from prolapsed hemorrhoids. Improved with steroid suppositories.  Hgb is holding stable in 11 gram range.  Will follow-up as outpatient to schedule colonoscopy after the new year and holidays.  Patient has an office visit follow-up with Dr. Arlyce Dice on 11/08/12 at 3:30 pm (this is in the D/C instructions under follow-up).    LOS: 3 days   Starlene Consuegra D.  10/09/2012, 9:41 AM  Pager number [phone]

## 2012-10-09 NOTE — Progress Notes (Signed)
Subjective:  Feeling better. No chest pain.  Objective:  Vital Signs in the last 24 hours: Temp:  [97.7 F (36.5 C)-97.9 F (36.6 C)] 97.7 F (36.5 C) (12/23 1400) Pulse Rate:  [79] 79  (12/23 0600) Cardiac Rhythm:  [-] Sinus bradycardia (12/23 0844) Resp:  [18] 18  (12/23 1400) BP: (109-144)/(77-102) 144/83 mmHg (12/23 1400) SpO2:  [99 %-100 %] 99 % (12/23 1400)  Physical Exam: BP Readings from Last 1 Encounters:  10/09/12 144/83    Wt Readings from Last 1 Encounters:  10/07/12 64.7 kg (142 lb 10.2 oz)    Weight change:   HEENT: [place]/AT, Eyes-Hazel, PERL, EOMI, Conjunctiva-Pale pink, Sclera-Non-icteric Neck: No JVD, No bruit, Trachea midline. Lungs:  Clear, Bilateral. Cardiac:  Regular rhythm, normal S1 and S2, no S3.  Abdomen:  Soft, non-tender. Extremities:  No edema present. No cyanosis. No clubbing. CNS: AxOx3, Cranial nerves grossly intact, moves all 4 extremities.  Skin: Warm and dry.   Intake/Output from previous day: 12/22 0701 - 12/23 0700 In: 978.8 [P.O.:240; I.V.:738.8] Out: 3600 [Urine:3600]    Lab Results: BMET    Component Value Date/Time   NA 134* 10/09/2012 0451   K 3.9 10/09/2012 0451   CL 101 10/09/2012 0451   CO2 25 10/09/2012 0451   GLUCOSE 81 10/09/2012 0451   BUN 4* 10/09/2012 0451   CREATININE 0.64 10/09/2012 0451   CALCIUM 9.4 10/09/2012 0451   GFRNONAA >90 10/09/2012 0451   GFRAA >90 10/09/2012 0451   CBC    Component Value Date/Time   WBC 6.4 10/09/2012 0451   RBC 4.49 10/09/2012 0451   HGB 11.6* 10/09/2012 0451   HCT 35.9* 10/09/2012 0451   PLT 210 10/09/2012 0451   MCV 80.0 10/09/2012 0451   MCH 25.8* 10/09/2012 0451   MCHC 32.3 10/09/2012 0451   RDW 21.5* 10/09/2012 0451   LYMPHSABS 2.7 10/06/2012 2046   MONOABS 0.5 10/06/2012 2046   EOSABS 0.1 10/06/2012 2046   BASOSABS 0.1 10/06/2012 2046   CARDIAC ENZYMES Lab Results  Component Value Date   TROPONINI <0.30 10/07/2012    Scheduled Meds:   . folic acid  1 mg  Oral Daily  . hydrocortisone   Rectal QID  . hydrocortisone  25 mg Rectal BID  . levofloxacin  750 mg Oral Daily  . lisinopril  20 mg Oral Daily  . LORazepam  0-4 mg Oral Q12H  . multivitamin with minerals  1 tablet Oral Daily  . pantoprazole  40 mg Oral Q1200  . thiamine  100 mg Oral Daily   Or  . thiamine  100 mg Intravenous Daily   Continuous Infusions:   . sodium chloride 75 mL/hr (10/09/12 1619)   PRN Meds:.acetaminophen, acetaminophen, hydrALAZINE, HYDROcodone-acetaminophen, LORazepam, LORazepam  Assessment/Plan:  Patient Active Hospital Problem List: Chest pain r/o CAD  COPD  GI bleed  Syncope from blood loss/Vagal  Alcohol use disorder  Tobacco use disorder  Anemia of blood loss   No reversible ischemia. Continue medical treatment. Follow up in 1 month.     LOS: 3 days    Orpah Cobb  MD  10/09/2012, 8:18 PM

## 2012-10-09 NOTE — Progress Notes (Signed)
Patient received back from [place] post stress test.  Patient is awake, alert and oriented x3.  Patient does not complain of any pain or shortness of breath.  Patient is hungry.  Dr. Rae Roam aware.  Put patient in for cardiac diet.  Will continue to monitor.

## 2012-10-10 DIAGNOSIS — K649 Unspecified hemorrhoids: Secondary | ICD-10-CM

## 2012-10-10 MED ORDER — LEVOFLOXACIN 750 MG PO TABS: 750.0000 mg | ORAL_TABLET | Freq: Every day | ORAL | Status: DC

## 2012-10-10 MED ORDER — THIAMINE HCL 100 MG PO TABS: 100.0000 mg | ORAL_TABLET | Freq: Every day | ORAL | Status: DC

## 2012-10-10 MED ORDER — HYDROCORTISONE 2.5 % RE CREA: TOPICAL_CREAM | Freq: Four times a day (QID) | RECTAL | Status: DC

## 2012-10-10 MED ORDER — HYDROCORTISONE ACETATE 25 MG RE SUPP: 25.0000 mg | Freq: Two times a day (BID) | RECTAL | Status: DC | PRN

## 2012-10-10 MED ORDER — LISINOPRIL 20 MG PO TABS: 20.0000 mg | ORAL_TABLET | Freq: Every day | ORAL | Status: DC

## 2012-10-10 NOTE — Discharge Summary (Signed)
[facility]  Physician Discharge Summary   Patient ID: Terry Bradley MRN: [id number] DOB/AGE: [birth year] 51 y.o.  Admit date: 10/06/2012 Discharge date: 10/10/2012  PCP: Does not have a PCP  DISCHARGE DIAGNOSES:  Principal Problem:  *Rectal bleed Active Problems:  Alcohol dependency  Homeless  GI bleed  Rectal prolapse  Chest pain  Anemia due to acute blood loss  Syncope  Hemorrhoids   RECOMMENDATIONS FOR OUTPATIENT FOLLOW UP: 1. Has follow up with GI for rectal bleeding. 2. Will need repeat CT chest in 3 months  DISCHARGE CONDITION: fair  Diet recommendation: Low Sodium  Filed Weights   10/06/12 2032 10/07/12 0338  Weight: 65.772 kg (145 lb) 64.7 kg (142 lb 10.2 oz)    INITIAL HISTORY: 23 year old homeless male who lives with a friend with a history of alcohol abuse and rectal bleeding presented with profuse rectal bleeding soaking through all his clothes. Patient stated that he has had rectal bleeding many times in the past, but it has gotten worse in the past 3 weeks, particularly today. As a result, he came to the emergency department for further evaluation. The patient was found to have a blood alcohol level of 418, although the patient stated that he only drank one 24 ounce beer today. He stated that he only drinks sparingly each week. He stated he only drinks beer without any liquor or wine. He does not believe he has a drinking problem. The patient was quite elusive when questioned regarding the quantity and duration of his alcohol use. In the emergency department, the patient was found with blood through all his clothes, and the patient continued to have some mild bleeding throughout the emergency department stay. As a result, admission was advised for further workup and monitoring. The patient also complained of chest discomfort and shortness of breath. He stated that this has been going on for approximately 3 weeks. He denied any nausea, diaphoresis,  syncope, headache. He denied any radiation of this chest discomfort to his arms or jaw. He complained of some dizziness without syncope. Denied any focal extremity weakness, dysarthria, visual disturbance. The patient denied any vomiting, diarrhea, abdominal pain. He complained of rectal pain. While being taken up to the floor patient had a syncopal episode.  Consultations:  [facility] for rectal bleeding  Cardiology: Dr. Algie Coffer for chest pain and abnormal CT chest  Procedures:  Nuclear Stress Test: No reversible ischemia   HOSPITAL COURSE:   Chest discomfort/shortness of breath  Suspect multifactorial due to tobacco use/COPD. CT chest revealed mod-severe disease in LAD and L CX. He ruled out for ACS by Troponin. Dr Algie Coffer was consulted for the abnormal Ct and chest pain. He recommended stress test which was low risk without reversible ischemia. No further evaluation necessary at this time. EKG shows no iscehmic changes. He can follow up with Dr. Algie Coffer as outpatient.  Rectal bleeding likely Hemorrhoidal  Appears to have resolved. HGb is stable. Likely hemorrhoidal but unable to rule out colonic pathology. GI saw the patient and will arrange outpatient follow up for consideration of colonoscopy. Can stop PPI.   Anemia secondary to acute blood loss  No significant drop.  Syncope  No seizure per ED notes. Could have been vagal or orthostatic. Orthostatic vital signs were normal. Troponins negative. EKG was non-ischemic. No focal neurological deficits. No further work up.  Abnormal "tree in bud" appearance of left lower lobe  Examination was not remarkable. CT chest reveals possible atypical infection with ground glass appearance and  nodularity. Treating with Levaquin and will need repeat study in 3 months. Doesn't have any symptoms currently. Could have aspirated. HIV is non-reactive.   Newly Diagnosed Hypertension BP better with Lisinopril. Will need outpatient management.  Alcohol abuse   He was on CIWA protocol. He was tremulous initially. But symptoms have improved. Thiamine supplementation. Counseling provided.  Tobacco abuse  Smoking cessation discussed   Overall patient remains stable. He will need outpatient follow up as discussed above. He is keen on being discharged. He is stable. Case manager to assist with medications.   PERTINENT LABS:  The results of significant diagnostics from this hospitalization (including imaging, microbiology, ancillary and laboratory) are listed below for reference.    Microbiology: Recent Results (from the past 240 hour(s))  MRSA PCR SCREENING     Status: Normal   Collection Time   10/07/12  4:54 AM      Component Value Range Status Comment   MRSA by PCR NEGATIVE  NEGATIVE Final      Labs: Basic Metabolic Panel:  Lab 10/09/12 1610 10/08/12 0525 10/07/12 1218 10/06/12 2046  NA 134* 133* 135 139  K 3.9 3.9 3.7 4.1  CL 101 98 99 100  CO2 25 25 25 25   GLUCOSE 81 80 114* 90  BUN 4* 5* 7 10  CREATININE 0.64 0.53 0.57 0.61  CALCIUM 9.4 9.1 8.9 9.0  MG -- -- 1.6 --  PHOS -- -- -- --   Liver Function Tests:  Lab 10/06/12 2046  AST 70*  ALT 32  ALKPHOS 81  BILITOT 0.1*  PROT 8.7*  ALBUMIN 3.9    Lab 10/06/12 2046  LIPASE 57  AMYLASE --   CBC:  Lab 10/09/12 0451 10/08/12 0525 10/07/12 1230 10/07/12 0625 10/07/12 0240 10/06/12 2046  WBC 6.4 6.4 14.9* 14.3* 10.2 --  NEUTROABS -- -- -- -- -- 2.3  HGB 11.6* 11.4* 10.8* 10.8* 10.4* --  HCT 35.9* 34.2* 32.7* 31.8* 31.4* --  MCV 80.0 79.0 79.0 77.9* 78.7 --  PLT 210 204 214 227 216 --   Cardiac Enzymes:  Lab 10/07/12 1218 10/07/12 0625  CKTOTAL -- --  CKMB -- --  CKMBINDEX -- --  TROPONINI <0.30 <0.30   CBG:  Lab 10/07/12 0219  GLUCAP 91     IMAGING STUDIES Ct Chest W Contrast  10/08/2012  *RADIOLOGY REPORT*  Clinical Data: Cough.  Tree in bud opacities identified at the left lung base on abdominopelvic imaging yesterday.  Evaluate for extent of  disease.  CT CHEST WITH CONTRAST  Technique:  Multidetector CT imaging of the chest was performed following the standard protocol during bolus administration of intravenous contrast.  Contrast: 80mL OMNIPAQUE IOHEXOL 300 MG/ML.  Comparison: Visualized lung bases on CT abdomen and pelvis yesterday.  CT chest 02/18/2010.  Findings: Emphysematous changes throughout both lungs, with apical blebs.  Hyperinflation.  Scattered ground-glass airspace opacities in the inferior left upper lobe and scattered throughout the left lower lobe. One of these opacities in the left lower lobe has a more ground-glass nodular appearance (series 4, image 36) measuring approximately 1.1 cm.   No involvement of the right lung.  No associated pleural effusions. Central airways patent with mild bronchial wall thickening.  Heart size normal.  No pericardial effusion.  Moderate to severe LAD and left circumflex coronary atherosclerosis for age.  Mild atherosclerosis involving the aortic arch.  Direct origin of the left vertebral artery from the arch.  No significant mediastinal, hilar, or axillary lymphadenopathy. Visualized thyroid gland  unremarkable.  Steatosis throughout the visualized liver as noted on CT abdomen yesterday.  Small hiatal hernia.  Please see the report that examination for full details.  Bone window images unremarkable.  IMPRESSION:  1.  Scattered ground-glass opacities in the inferior left upper lobe and throughout the left lower lobe, likely inflammatory or infectious.  One of these opacities in the left lower lobe has a more ground-glass nodular appearance, however, so follow-up CT imaging after treatment (2-3 months) is suggested to confirm resolution. If the ground-glass opacities persist, further imaging recommendations will be made at that time. 2.  COPD/emphysema. 3.  Moderate to severe LAD and left of complex coronary atherosclerosis for age. 4.  Upper abdominal imaging findings as detailed on the abdominopelvic CT  examination yesterday, including hepatic steatosis and small hiatal hernia.   Original Report Authenticated By: Hulan Saas, M.D.    Ct Abdomen Pelvis W Contrast  10/06/2012  *RADIOLOGY REPORT*  Clinical Data: Rectal bleeding.  Abdominal pain.  CT ABDOMEN AND PELVIS WITH CONTRAST  Technique:  Multidetector CT imaging of the abdomen and pelvis was performed following the standard protocol during bolus administration of intravenous contrast.  Contrast: 80mL OMNIPAQUE IOHEXOL 300 MG/ML  SOLN  Comparison: 11/28/2011.  Findings: Lung Bases: Tree in bud micronodularity is present within the left lower lobe.  Findings suggestive of atypical infection including Mycobacterium avium.  Liver:  Fatty liver.  No focal mass lesion.  Focal fatty infiltration adjacent to the falciform ligament.  Spleen:  Normal.  Gallbladder:  Distended.  No calcified stones.  Common bile duct:  Normal.  Pancreas:  Normal.  Adrenal glands:  Normal.  Kidneys:  16 mm left interpolar renal cyst is simple.  2 mm calculus in the right inferior pole collecting system.  The ureters appear within normal limits.  Delayed excretion of contrast from the kidneys is normal.  Stomach:  Small hiatal hernia.  Small bowel:  Duodenum normal.  No small bowel obstruction.  No mesenteric adenopathy.  Colon:   Normal appendix.  Colon is within normal limits.  Pelvic Genitourinary:  Distended urinary bladder.  No free fluid. No adenopathy.  Bones:  No aggressive osseous lesions.  Large Schmorl's node in the right L3 vertebra with partial collapse, chronic.  Mild right hip osteoarthritis.  Vasculature: Atherosclerosis without aneurysm.  IMPRESSION: 1.  No acute abdominal abnormality. 2.  Fatty liver.  3.  Small hiatal hernia. 4.  Atherosclerosis. 5.  Tree in bud micronodularity in the left lower lobe, most commonly associated with infection, including atypical agents. 6.  Simple left renal cyst.   Original Report Authenticated By: Andreas Newport, M.D.    Nm  Myocar Multi W/spect W/wall Motion / Ef  10/09/2012  *RADIOLOGY REPORT*  Clinical Data:  Chest pain  MYOCARDIAL IMAGING WITH SPECT (REST AND PHARMACOLOGIC-STRESS) GATED LEFT VENTRICULAR WALL MOTION STUDY LEFT VENTRICULAR EJECTION FRACTION  Technique:  Standard myocardial SPECT imaging was performed after resting intravenous injection of 10 mCi Tc-48m tetrofosmin. Subsequently, intravenous infusion of Lexiscan was performed under the supervision of the Cardiology staff.  At peak effect of the drug, 30 mCi Tc-46m tetrofosmin was injected intravenously and standard myocardial SPECT imaging was performed.  Quantitative gated imaging was also performed to evaluate left ventricular wall motion and estimate left ventricular ejection fraction.  Comparison:  None.  Findings:  Spect:  No definite inducible or reversible ischemia demonstrated with pharmacologic stress.  No fixed defects.  Wall motion:  Grossly normal wall motion and thickening  Ejection fraction:  Calculated Q G S ejection fraction is 53%  IMPRESSION: Negative for inducible ischemia with pharmacologic stress   Original Report Authenticated By: Judie Petit. Shick, M.D.     DISCHARGE EXAMINATION: Filed Vitals:   10/09/12 0600 10/09/12 1400 10/09/12 2107 10/10/12 0456  BP: 123/88 144/83 139/90 125/83  Pulse: 79  74 79  Temp: 97.7 F (36.5 C) 97.7 F (36.5 C) 97.7 F (36.5 C) 98.1 F (36.7 C)  TempSrc: Oral Oral Oral Oral  Resp:  18 18 18   Height:      Weight:      SpO2: 100% 99% 99% 99%   General appearance: alert, cooperative, appears stated age and no distress Resp: clear to auscultation bilaterally Cardio: regular rate and rhythm, S1, S2 normal, no murmur, click, rub or gallop GI: soft, non-tender; bowel sounds normal; no masses,  no organomegaly Neurologic: Alert and oriented x 3. No focal deficits.  DISPOSITION: Will go to his friend's place  Discharge Orders    Future Appointments: Provider: Department: Dept Phone: Center:   11/08/2012  3:30 PM Louis Meckel, MD [facility] [phone] [facility]     Current Discharge Medication List    START taking these medications   Details  hydrocortisone (ANUSOL-HC) 2.5 % rectal cream Place rectally 4 (four) times daily. Qty: 30 g, Refills: 1    hydrocortisone (ANUSOL-HC) 25 MG suppository Place 1 suppository (25 mg total) rectally 2 (two) times daily as needed for hemorrhoids. Qty: 12 suppository, Refills: 0    levofloxacin (LEVAQUIN) 750 MG tablet Take 1 tablet (750 mg total) by mouth daily. For 2 more days starting 12/25 Qty: 2 tablet, Refills: 0    lisinopril (PRINIVIL,ZESTRIL) 20 MG tablet Take 1 tablet (20 mg total) by mouth daily. Qty: 30 tablet, Refills: 0    thiamine 100 MG tablet Take 1 tablet (100 mg total) by mouth daily. Qty: 30 tablet, Refills: 0       Follow-up Information    Follow up with Melvia Heaps, MD. On 11/08/2012. (3:30 pm)    Contact information:   [address] [place] [address] [phone]       Follow up with [facility] S, MD. Schedule an appointment as soon as possible for a visit in 1 month.   Contact information:   [address] [place] [address] [phone]          TOTAL DISCHARGE TIME: 35 mins  [facility]  [facility] Pager [phone]  10/10/2012, 7:54 AM

## 2012-10-10 NOTE — Progress Notes (Signed)
Discharge instructions given to pt, verbalized understanding. Left the unit in stable condition. 

## 2012-11-08 ENCOUNTER — Ambulatory Visit: Payer: Self-pay | Admitting: Gastroenterology

## 2012-11-15 ENCOUNTER — Emergency Department (HOSPITAL_COMMUNITY): Payer: Self-pay

## 2012-11-15 ENCOUNTER — Encounter (HOSPITAL_COMMUNITY): Payer: Self-pay | Admitting: *Deleted

## 2012-11-15 ENCOUNTER — Inpatient Hospital Stay (HOSPITAL_COMMUNITY): Payer: Self-pay

## 2012-11-15 ENCOUNTER — Inpatient Hospital Stay (HOSPITAL_COMMUNITY)
Admission: EM | Admit: 2012-11-15 | Discharge: 2012-11-17 | DRG: 896 | Disposition: A | Discharge status: Home / Self Care | Payer: MEDICAID | Attending: Internal Medicine | Admitting: Internal Medicine

## 2012-11-15 DIAGNOSIS — D649 Anemia, unspecified: Secondary | ICD-10-CM | POA: Diagnosis present

## 2012-11-15 DIAGNOSIS — Z5900 Homelessness unspecified: Secondary | ICD-10-CM

## 2012-11-15 DIAGNOSIS — K219 Gastro-esophageal reflux disease without esophagitis: Secondary | ICD-10-CM | POA: Diagnosis present

## 2012-11-15 DIAGNOSIS — G9340 Encephalopathy, unspecified: Secondary | ICD-10-CM | POA: Diagnosis present

## 2012-11-15 DIAGNOSIS — X31XXXA Exposure to excessive natural cold, initial encounter: Secondary | ICD-10-CM | POA: Diagnosis present

## 2012-11-15 DIAGNOSIS — Z8719 Personal history of other diseases of the digestive system: Secondary | ICD-10-CM

## 2012-11-15 DIAGNOSIS — D72829 Elevated white blood cell count, unspecified: Secondary | ICD-10-CM | POA: Diagnosis present

## 2012-11-15 DIAGNOSIS — J4489 Other specified chronic obstructive pulmonary disease: Secondary | ICD-10-CM | POA: Diagnosis present

## 2012-11-15 DIAGNOSIS — Z79899 Other long term (current) drug therapy: Secondary | ICD-10-CM

## 2012-11-15 DIAGNOSIS — E870 Hyperosmolality and hypernatremia: Secondary | ICD-10-CM | POA: Diagnosis not present

## 2012-11-15 DIAGNOSIS — T68XXXA Hypothermia, initial encounter: Secondary | ICD-10-CM | POA: Diagnosis present

## 2012-11-15 DIAGNOSIS — F10929 Alcohol use, unspecified with intoxication, unspecified: Secondary | ICD-10-CM | POA: Diagnosis present

## 2012-11-15 DIAGNOSIS — Z880 Allergy status to penicillin: Secondary | ICD-10-CM

## 2012-11-15 DIAGNOSIS — Y9289 Other specified places as the place of occurrence of the external cause: Secondary | ICD-10-CM

## 2012-11-15 DIAGNOSIS — F10229 Alcohol dependence with intoxication, unspecified: Principal | ICD-10-CM | POA: Diagnosis present

## 2012-11-15 DIAGNOSIS — F17200 Nicotine dependence, unspecified, uncomplicated: Secondary | ICD-10-CM | POA: Diagnosis present

## 2012-11-15 DIAGNOSIS — Z886 Allergy status to analgesic agent status: Secondary | ICD-10-CM

## 2012-11-15 DIAGNOSIS — J449 Chronic obstructive pulmonary disease, unspecified: Secondary | ICD-10-CM | POA: Diagnosis present

## 2012-11-15 DIAGNOSIS — I10 Essential (primary) hypertension: Secondary | ICD-10-CM | POA: Diagnosis present

## 2012-11-15 LAB — CBC WITH DIFFERENTIAL/PLATELET
Eosinophils Absolute: 0.1 10*3/uL (ref 0.0–0.7)
Eosinophils Relative: 1 % (ref 0–5)
HCT: 36.6 % — ABNORMAL LOW (ref 39.0–52.0)
Hemoglobin: 11.8 g/dL — ABNORMAL LOW (ref 13.0–17.0)
Lymphocytes Relative: 23 % (ref 12–46)
Lymphs Abs: 3 10*3/uL (ref 0.7–4.0)
MCH: 26.5 pg (ref 26.0–34.0)
MCV: 82.1 fL (ref 78.0–100.0)
Monocytes Absolute: 1 10*3/uL (ref 0.1–1.0)
Monocytes Relative: 8 % (ref 3–12)
RBC: 4.46 MIL/uL (ref 4.22–5.81)
WBC: 13 10*3/uL — ABNORMAL HIGH (ref 4.0–10.5)

## 2012-11-15 LAB — COMPREHENSIVE METABOLIC PANEL
AST: 27 U/L (ref 0–37)
Albumin: 3.6 g/dL (ref 3.5–5.2)
BUN: 8 mg/dL (ref 6–23)
Chloride: 104 mEq/L (ref 96–112)
Creatinine, Ser: 0.53 mg/dL (ref 0.50–1.35)
Total Protein: 7.8 g/dL (ref 6.0–8.3)

## 2012-11-15 LAB — MRSA PCR SCREENING: MRSA by PCR: NEGATIVE

## 2012-11-15 LAB — URINALYSIS, ROUTINE W REFLEX MICROSCOPIC
Bilirubin Urine: NEGATIVE
Glucose, UA: NEGATIVE mg/dL
Hgb urine dipstick: NEGATIVE
Ketones, ur: NEGATIVE mg/dL
Leukocytes, UA: NEGATIVE
pH: 6.5 (ref 5.0–8.0)

## 2012-11-15 LAB — RAPID URINE DRUG SCREEN, HOSP PERFORMED
Barbiturates: NOT DETECTED
Benzodiazepines: NOT DETECTED
Cocaine: NOT DETECTED
Tetrahydrocannabinol: NOT DETECTED

## 2012-11-15 LAB — CK: Total CK: 170 U/L (ref 7–232)

## 2012-11-15 MED ORDER — HALOPERIDOL LACTATE 5 MG/ML IJ SOLN
5.0000 mg | Freq: Four times a day (QID) | INTRAMUSCULAR | Status: DC | PRN
  Administered 2012-11-15: 5 mg via INTRAVENOUS

## 2012-11-15 MED ORDER — SODIUM CHLORIDE 0.9 % IJ SOLN
3.0000 mL | Freq: Two times a day (BID) | INTRAMUSCULAR | Status: DC
  Administered 2012-11-15 – 2012-11-16 (×2): 3 mL via INTRAVENOUS

## 2012-11-15 MED ORDER — FOLIC ACID 1 MG PO TABS
1.0000 mg | ORAL_TABLET | Freq: Every day | ORAL | Status: DC
  Administered 2012-11-16 – 2012-11-17 (×2): 1 mg via ORAL
  Filled 2012-11-15 (×3): qty 1

## 2012-11-15 MED ORDER — LORAZEPAM 2 MG/ML IJ SOLN
1.0000 mg | Freq: Four times a day (QID) | INTRAMUSCULAR | Status: DC | PRN
  Administered 2012-11-15 – 2012-11-17 (×2): 1 mg via INTRAVENOUS
  Filled 2012-11-15: qty 2
  Filled 2012-11-15 (×5): qty 1

## 2012-11-15 MED ORDER — ADULT MULTIVITAMIN W/MINERALS CH
1.0000 | ORAL_TABLET | Freq: Every day | ORAL | Status: DC
  Administered 2012-11-16 – 2012-11-17 (×2): 1 via ORAL
  Filled 2012-11-15 (×3): qty 1

## 2012-11-15 MED ORDER — HALOPERIDOL LACTATE 5 MG/ML IJ SOLN
INTRAMUSCULAR | Status: AC
  Filled 2012-11-15: qty 1

## 2012-11-15 MED ORDER — THIAMINE HCL 100 MG/ML IJ SOLN
Freq: Once | INTRAVENOUS | Status: AC
  Administered 2012-11-15: 16:00:00 via INTRAVENOUS
  Filled 2012-11-15: qty 1000

## 2012-11-15 MED ORDER — THIAMINE HCL 100 MG/ML IJ SOLN
100.0000 mg | Freq: Every day | INTRAMUSCULAR | Status: DC
  Administered 2012-11-15: 100 mg via INTRAVENOUS
  Filled 2012-11-15 (×3): qty 1

## 2012-11-15 MED ORDER — VITAMIN B-1 100 MG PO TABS
100.0000 mg | ORAL_TABLET | Freq: Every day | ORAL | Status: DC
  Filled 2012-11-15: qty 1

## 2012-11-15 MED ORDER — SENNOSIDES-DOCUSATE SODIUM 8.6-50 MG PO TABS
1.0000 | ORAL_TABLET | Freq: Every evening | ORAL | Status: DC | PRN
  Filled 2012-11-15: qty 1

## 2012-11-15 MED ORDER — SODIUM CHLORIDE 0.9 % IV SOLN
INTRAVENOUS | Status: DC
  Administered 2012-11-15 – 2012-11-16 (×2): via INTRAVENOUS

## 2012-11-15 MED ORDER — ACETAMINOPHEN 650 MG RE SUPP: 650.0000 mg | Freq: Four times a day (QID) | RECTAL | Status: DC | PRN

## 2012-11-15 MED ORDER — ALUM & MAG HYDROXIDE-SIMETH 200-200-20 MG/5ML PO SUSP
30.0000 mL | Freq: Four times a day (QID) | ORAL | Status: DC | PRN
Start: 2012-11-15 — End: 2012-11-17

## 2012-11-15 MED ORDER — ACETAMINOPHEN 325 MG PO TABS: 650.0000 mg | ORAL_TABLET | Freq: Four times a day (QID) | ORAL | Status: DC | PRN

## 2012-11-15 MED ORDER — LORAZEPAM 2 MG/ML IJ SOLN
0.0000 mg | Freq: Four times a day (QID) | INTRAMUSCULAR | Status: DC
  Administered 2012-11-15: 2 mg via INTRAVENOUS
  Administered 2012-11-16: 1 mg via INTRAVENOUS
  Administered 2012-11-17: 3 mg via INTRAVENOUS
  Administered 2012-11-17: 1 mg via INTRAVENOUS

## 2012-11-15 MED ORDER — VITAMIN B-1 100 MG PO TABS
100.0000 mg | ORAL_TABLET | Freq: Every day | ORAL | Status: DC
  Administered 2012-11-16 – 2012-11-17 (×2): 100 mg via ORAL
  Filled 2012-11-15 (×3): qty 1

## 2012-11-15 MED ORDER — OXYCODONE HCL 5 MG PO TABS
5.0000 mg | ORAL_TABLET | ORAL | Status: DC | PRN
  Administered 2012-11-17: 5 mg via ORAL
  Filled 2012-11-15: qty 1

## 2012-11-15 MED ORDER — FLEET ENEMA 7-19 GM/118ML RE ENEM: 1.0000 | ENEMA | Freq: Once | RECTAL | Status: AC | PRN

## 2012-11-15 MED ORDER — PANTOPRAZOLE SODIUM 40 MG PO TBEC
40.0000 mg | DELAYED_RELEASE_TABLET | Freq: Every day | ORAL | Status: DC
  Administered 2012-11-16 – 2012-11-17 (×2): 40 mg via ORAL
  Filled 2012-11-15 (×3): qty 1

## 2012-11-15 MED ORDER — LORAZEPAM 1 MG PO TABS
1.0000 mg | ORAL_TABLET | Freq: Four times a day (QID) | ORAL | Status: DC | PRN
  Administered 2012-11-16 – 2012-11-17 (×2): 1 mg via ORAL
  Filled 2012-11-15 (×2): qty 1

## 2012-11-15 MED ORDER — HYDROCORTISONE ACETATE 25 MG RE SUPP
25.0000 mg | Freq: Two times a day (BID) | RECTAL | Status: DC | PRN
  Filled 2012-11-15: qty 1

## 2012-11-15 MED ORDER — ONDANSETRON HCL 4 MG PO TABS: 4.0000 mg | ORAL_TABLET | Freq: Four times a day (QID) | ORAL | Status: DC | PRN

## 2012-11-15 MED ORDER — LORAZEPAM 2 MG/ML IJ SOLN: 0.0000 mg | Freq: Two times a day (BID) | INTRAMUSCULAR | Status: DC

## 2012-11-15 MED ORDER — BISACODYL 5 MG PO TBEC: 5.0000 mg | DELAYED_RELEASE_TABLET | Freq: Every day | ORAL | Status: DC | PRN

## 2012-11-15 MED ORDER — ONDANSETRON HCL 4 MG/2ML IJ SOLN: 4.0000 mg | Freq: Four times a day (QID) | INTRAMUSCULAR | Status: DC | PRN
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
## 2012-11-15 NOTE — ED Notes (Signed)
Report given to Pam, rn 

## 2012-11-15 NOTE — ED Notes (Signed)
Warm blankets and warm packs applied to pt. Pt kicking off blankets. rn and charge trying to calm pt down.

## 2012-11-15 NOTE — ED Notes (Addendum)
Pt found laying in snow next to his tent. Ems transported to ED. Pt mumbling and uncooperative. unable to tell birthday. Rectal temp checked, wet clothes removed, and warm blankets applied. Pt placed on cardiac monitor
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
## 2012-11-15 NOTE — Progress Notes (Signed)
ED CM noted pt to 2 west, cm consult for medication needs and noted hx of homelessness (found near tent in snow) SW consult order entered Medication needs can be addressed at d/c Pt with second hospitalization in 6 months Last admission/discharge 12/20-12/24/13 for GI bleed

## 2012-11-15 NOTE — Progress Notes (Signed)
Upon entering room pt standing up bedside bed very confused stating "i have to pee." informed  Pt that he has a foley catheter. Pt w/o any signs of acknowledgement. Pt also pulled IV tubing apart and took off all leads. MD videoed in and informed of pt hx and status. MD stated to place in restraints and give pt haldol. 3 rns' and house supervisor in room to help calm pt down and reorient prior to placing in restraints. Security called and also attempted several times to verbally deescalate pt to no avail. Pt placed in restraints and haldol given will continue to monitor pt status. Pt also placed on 2l/[place] o2

## 2012-11-15 NOTE — ED Notes (Signed)
md alerted of critical value 

## 2012-11-16 DIAGNOSIS — D72829 Elevated white blood cell count, unspecified: Secondary | ICD-10-CM

## 2012-11-16 DIAGNOSIS — E870 Hyperosmolality and hypernatremia: Secondary | ICD-10-CM | POA: Diagnosis not present

## 2012-11-16 LAB — COMPREHENSIVE METABOLIC PANEL
AST: 23 U/L (ref 0–37)
CO2: 24 mEq/L (ref 19–32)
Chloride: 113 mEq/L — ABNORMAL HIGH (ref 96–112)
Creatinine, Ser: 0.66 mg/dL (ref 0.50–1.35)
GFR calc non Af Amer: >90 mL/min (ref >90)
Glucose, Bld: 78 mg/dL (ref 70–99)
Total Bilirubin: 0.1 mg/dL — ABNORMAL LOW (ref 0.3–1.2)

## 2012-11-16 LAB — CBC
MCH: 26.7 pg (ref 26.0–34.0)
MCHC: 32.6 g/dL (ref 30.0–36.0)
Platelets: 366 10*3/uL (ref 150–400)
RBC: 4.12 MIL/uL — ABNORMAL LOW (ref 4.22–5.81)

## 2012-11-16 LAB — ETHANOL: Alcohol, Ethyl (B): 99 mg/dL — ABNORMAL HIGH (ref 0–11)

## 2012-11-16 MED ORDER — SODIUM CHLORIDE 0.45 % IV SOLN
INTRAVENOUS | Status: DC
Start: 2012-11-16 — End: 2012-11-17
  Administered 2012-11-16 – 2012-11-17 (×2): via INTRAVENOUS

## 2012-11-16 MED ORDER — LABETALOL HCL 5 MG/ML IV SOLN
5.0000 mg | INTRAVENOUS | Status: DC | PRN
  Administered 2012-11-17: 5 mg via INTRAVENOUS
  Filled 2012-11-16: qty 4

## 2012-11-16 NOTE — Progress Notes (Signed)
[facility] PROGRESS NOTE  Terry Bradley ZOX:[id number] DOB: 11-17-1960 DOA: 11/15/2012 PCP: Provider Not In System  Assessment/Plan:  #1 acute encephalopathy  Likely secondary to alcohol intoxication. Patient states he drank a pint of vodka prior to admission. Patient doesn't remember being brought to the hospital. Some clinical improvement this morning. Patient is alert and oriented x3. Patient's alcohol level was 448 on admission. Repeat alcohol level is 99 this morning. Chest x-ray was negative for any acute infiltrate. Urinalysis is negative. Patient is currently afebrile. Patient with no focal neurological deficits. Continue Ativan withdrawal protocol. Haldol as needed. Follow. #2 hypothermia  Likely secondary to recent extreme cold weather which was in the lower teens on morning of admission. Chest x-ray was negative for any acute infiltrate. Urinalysis is negative. Patient with no signs or symptoms of infection. Hypothermia has improved. Patient was found in the snow face down outside his tent.  Glucose level was 91 her comprehensive metabolic profile. Monitor closely.  #3 alcohol intoxication  Alcohol level on admission was 448. Current alcohol level is 99. Patient states he drank a pint of vodka prior to admission. Patient states couldn't remember he was brought to the hospital. Continue Ativan withdrawal protocol. Will consult with social worker for alcohol dependency.   #4 anemia  Follow H&H. Continue PPI.  #5 leukocytosis  Likely a reactive leukocytosis. Urinalysis is negative. Chest x-ray is negative. Patient currently afebrile. No signs or symptoms of infection. Leukocytosis is trending down. No need for antibiotics at this time. Follow.  #6 hypernatremia Change IV fluids to half-normal saline. Follow. #7 prophylaxis  PPI for GI prophylaxis. SCDs for DVT prophylaxis.      Code Status: Full Family Communication: Updated patient no family at bedside. Disposition Plan:  Patient is currently homeless. Will consult social work for disposition pending medical stability.   Consultants:  None  Procedures:  Chest x-ray 11/15/2012  Antibiotics:  None  HPI/Subjective: Patient denies any chest pain. No shortness of breath. No complaints. Patient states doesn't remember being brought to the hospital yesterday. Patient states drank a pint of vodka prior to hospitalization. Events overnight noted.  Objective: Filed Vitals:   11/15/12 2020 11/16/12 0026 11/16/12 0400 11/16/12 0600  BP: 110/79 107/66 115/73 143/91  Pulse: 91 97 71   Temp:  98.8 F (37.1 C) 98.6 F (37 C) 98.8 F (37.1 C)  TempSrc:  Oral Core (Comment) Core (Comment)  Resp: 18     Height:      Weight:      SpO2: 93% 97% 96% 99%    Intake/Output Summary (Last 24 hours) at 11/16/12 0837 Last data filed at 11/16/12 8119  Gross per 24 hour  Intake 1861.67 ml  Output   3150 ml  Net -1288.33 ml   Filed Weights   11/15/12 1514  Weight: 66.1 kg (145 lb 11.6 oz)    Exam:   General:  NAD  Cardiovascular: RRR. No LE edema  Respiratory: CTAB  Abdomen: Soft/NT/ND/+BS  Data Reviewed: Basic Metabolic Panel:  Lab 11/16/12 1478 11/15/12 1250  NA 146* 139  K 3.9 3.5  CL 113* 104  CO2 24 18*  GLUCOSE 78 91  BUN 6 8  CREATININE 0.66 0.53  CALCIUM 8.4 9.1  MG -- 2.3  PHOS -- 4.2   Liver Function Tests:  Lab 11/16/12 0335 11/15/12 1250  AST 23 27  ALT 14 19  ALKPHOS 61 90  BILITOT 0.1* 0.1*  PROT 7.1 7.8  ALBUMIN 3.0* 3.6   No  results found for this basename: LIPASE:5,AMYLASE:5 in the last 168 hours No results found for this basename: AMMONIA:5 in the last 168 hours CBC:  Lab 11/16/12 0335 11/15/12 1250  WBC 12.6* 13.0*  NEUTROABS -- 8.8*  HGB 11.0* 11.8*  HCT 33.7* 36.6*  MCV 81.8 82.1  PLT 366 412*   Cardiac Enzymes:  Lab 11/15/12 1250  CKTOTAL 170  CKMB --  CKMBINDEX --  TROPONINI --   BNP (last 3 results) No results found for this basename:  PROBNP:3 in the last 8760 hours CBG:  Lab 11/15/12 1243  GLUCAP 94    Recent Results (from the past 240 hour(s))  MRSA PCR SCREENING     Status: Normal   Collection Time   11/15/12  3:24 PM      Component Value Range Status Comment   MRSA by PCR NEGATIVE  NEGATIVE Final      Studies: Dg Chest Port 1 View  11/15/2012  *RADIOLOGY REPORT*  Clinical Data: Chest pain.  Hypertension.  Asthma.  Smoking history.  PORTABLE CHEST - 1 VIEW  Comparison: 02/19/2009.  Findings:   artifact overlies the chest.  Heart size is normal. Mediastinal shadows are normal.  The lungs are clear.  The vascularity is normal.  No significant bony finding.  IMPRESSION: No active disease   Original Report Authenticated By: Paulina Fusi, M.D.     Scheduled Meds:   . folic acid  1 mg Oral Daily  . LORazepam  0-4 mg Intravenous Q6H   Followed by  . LORazepam  0-4 mg Intravenous Q12H  . multivitamin with minerals  1 tablet Oral Daily  . pantoprazole  40 mg Oral Q breakfast  . sodium chloride  3 mL Intravenous Q12H  . thiamine  100 mg Oral Daily   Or  . thiamine  100 mg Intravenous Daily   Continuous Infusions:   . sodium chloride      Principal Problem:  *Encephalopathy acute Active Problems:  Anemia  Hypothermia  Alcohol intoxication  Leukocytosis  Hypernatremia    Time spent: > 35 mins    [facility]  [facility] Pager [phone]. If 8PM-8AM, please contact night-coverage at [email or web address], password [facility] 11/16/2012, 8:37 AM  LOS: 1 day

## 2012-11-16 NOTE — Evaluation (Signed)
Occupational Therapy Evaluation Patient Details Name: Terry Bradley MRN: [id number] DOB: 1961-07-10 Today's Date: 11/16/2012 Time: 7616-0737 OT Time Calculation (min): 12 min  OT Assessment / Plan / Recommendation Clinical Impression  Pt presents to OT at baseline with ADL activity. Pt is homeless and plans on going back to a shelter upon DC    OT Assessment  Patient does not need any further OT services    Follow Up Recommendations  No OT follow up                Precautions / Restrictions Precautions Precautions: None Restrictions Weight Bearing Restrictions: No       ADL  Upper Body Bathing: Simulated;Independent Where Assessed - Upper Body Bathing: Unsupported sitting Lower Body Bathing: Simulated;Independent Where Assessed - Lower Body Bathing: Unsupported sit to stand Upper Body Dressing: Simulated;Independent Where Assessed - Upper Body Dressing: Unsupported sitting Lower Body Dressing: Performed;Independent Where Assessed - Lower Body Dressing: Unsupported sit to stand Toilet Transfer: Performed;Independent Toilet Transfer Method: Sit to Barista: Regular height toilet Toileting - Clothing Manipulation and Hygiene: Independent Where Assessed - Engineer, mining and Hygiene: Standing Tub/Shower Transfer: Performed;Independent Tub/Shower Transfer Method: Ambulating        Visit Information  Last OT Received On: 11/16/12 Assistance Needed: +1    Subjective Data  Subjective: I am feeling great   Prior Functioning     Home Living Lives With: Alone Available Help at Discharge: Other (Comment) (going [place] stay in a church after leaving hospital) Type of Home: Homeless Home Adaptive Equipment: None Prior Function Level of Independence: Independent Able to Take Stairs?: Yes Driving: No Vocation: Unemployed Communication Communication: No difficulties            Cognition  Overall Cognitive Status: Appears  within functional limits for tasks assessed/performed Arousal/Alertness: Awake/alert Orientation Level: Appears intact for tasks assessed Behavior During Session: [facility] for tasks performed    Extremity/Trunk Assessment Right Upper Extremity Assessment RUE ROM/Strength/Tone: [facility] for tasks assessed Left Upper Extremity Assessment LUE ROM/Strength/Tone: WFL for tasks assessed Right Lower Extremity Assessment RLE ROM/Strength/Tone: WFL for tasks assessed RLE Sensation: WFL - Light Touch RLE Coordination: WFL - gross/fine motor Left Lower Extremity Assessment LLE ROM/Strength/Tone: WFL for tasks assessed LLE Sensation: WFL - Light Touch LLE Coordination: WFL - gross/fine motor Trunk Assessment Trunk Assessment: Normal     Mobility Transfers Sit to Stand: 7: Independent Stand to Sit: 7: Independent           Balance High Level Balance High Level Balance Activites: Head turns High Level Balance Comments: Noted very minor instability with head turns side to side and up/down, no LOB.    End of Session OT - End of Session Activity Tolerance: Patient tolerated treatment well Patient left: in chair  GO     Keyla Milone, [place] 11/16/2012, 11:22 AM

## 2012-11-16 NOTE — Clinical Social Work Note (Signed)
CSW reviewed chart after receiving consult for homelessness and ETOH. Pt in wheelchair going to 5th floor when CSW attempted to assess. Pt aware of consult and that CSW will meet with him upstairs. Pt agrees to consult currently.  CSW noted Pt went to [facility] in 2/13 for detox and then was set up at [facility] per d/c summary from [facility].  This CSW will give report to 5th floor CSW.   Doreen Salvage, LCSW ICU/Stepdown Clinical Social Worker [facility] Cell [phone] Hours 8am-1200pm M-F

## 2012-11-16 NOTE — Evaluation (Signed)
Physical Therapy One Time Evaluation Patient Details Name: Terry Bradley MRN: [id number] DOB: 1961/10/06 Today's Date: 11/16/2012 Time: 8469-6295 PT Time Calculation (min): 10 min  PT Assessment / Plan / Recommendation Clinical Impression  Pt presents with acute encephalopathy with history of ETOH abuse, substance abuse and COPD.  Noted that he also came in with hypothermia.  Tolerated OOB and ambulation very well in hallway at independent level and no LOB noted.  Pt states that he will go to stay at a church when he leaves the hospital.  Pt will not require any further therapy in acute venue or at home.  PT to sign off at this time.      PT Assessment  Patent does not need any further PT services    Follow Up Recommendations  No PT follow up    Does the patient have the potential to tolerate intense rehabilitation      Barriers to Discharge        Equipment Recommendations  None recommended by PT    Recommendations for Other Services     Frequency      Precautions / Restrictions Precautions Precautions: None Restrictions Weight Bearing Restrictions: No   Pertinent Vitals/Pain No pain      Mobility  Transfers Transfers: Sit to Stand;Stand to Sit Sit to Stand: 7: Independent Stand to Sit: 7: Independent Ambulation/Gait Ambulation/Gait Assistance: 7: Independent Assistive device: None Gait Pattern: Within Functional Limits Gait velocity: WFL Stairs: No Wheelchair Mobility Wheelchair Mobility: No    Shoulder Instructions     Exercises     PT Diagnosis:    PT Problem List:   PT Treatment Interventions:     PT Goals    Visit Information  Last PT Received On: 11/16/12 Assistance Needed: +1 PT/OT Co-Evaluation/Treatment: Yes    Subjective Data  Subjective: I just feel shaky Patient Stated Goal: I'm going to go to a church to stay when I leave here.    Prior Functioning  Home Living Lives With: Alone Available Help at Discharge: Other (Comment) (going  to stay in a church after leaving hospital) Type of Home: Homeless Home Adaptive Equipment: None Prior Function Level of Independence: Independent Able to Take Stairs?: Yes Driving: No Vocation: Unemployed Communication Communication: No difficulties    Cognition  Overall Cognitive Status: Appears within functional limits for tasks assessed/performed Arousal/Alertness: Awake/alert Orientation Level: Appears intact for tasks assessed Behavior During Session: [facility] for tasks performed    Extremity/Trunk Assessment Right Upper Extremity Assessment RUE ROM/Strength/Tone: [facility] for tasks assessed Left Upper Extremity Assessment LUE ROM/Strength/Tone: WFL for tasks assessed Right Lower Extremity Assessment RLE ROM/Strength/Tone: WFL for tasks assessed RLE Sensation: WFL - Light Touch RLE Coordination: WFL - gross/fine motor Left Lower Extremity Assessment LLE ROM/Strength/Tone: WFL for tasks assessed LLE Sensation: WFL - Light Touch LLE Coordination: WFL - gross/fine motor Trunk Assessment Trunk Assessment: Normal   Balance High Level Balance High Level Balance Activites: Head turns High Level Balance Comments: Noted very minor instability with head turns side to side and up/down, no LOB.   End of Session PT - End of Session Activity Tolerance: Patient tolerated treatment well Patient left: in chair;with call bell/phone within reach Nurse Communication: Mobility status  GP     Terry Bradley 11/16/2012, 11:30 AM

## 2012-11-17 DIAGNOSIS — T68XXXA Hypothermia, initial encounter: Secondary | ICD-10-CM

## 2012-11-17 DIAGNOSIS — F1010 Alcohol abuse, uncomplicated: Secondary | ICD-10-CM

## 2012-11-17 DIAGNOSIS — D649 Anemia, unspecified: Secondary | ICD-10-CM

## 2012-11-17 DIAGNOSIS — G9340 Encephalopathy, unspecified: Secondary | ICD-10-CM

## 2012-11-17 LAB — CBC
Platelets: 374 10*3/uL (ref 150–400)
RDW: 19.4 % — ABNORMAL HIGH (ref 11.5–15.5)
WBC: 9 10*3/uL (ref 4.0–10.5)

## 2012-11-17 LAB — ETHANOL: Alcohol, Ethyl (B): <11 mg/dL (ref 0–11)

## 2012-11-17 LAB — BASIC METABOLIC PANEL
Calcium: 9 mg/dL (ref 8.4–10.5)
Chloride: 103 mEq/L (ref 96–112)
Creatinine, Ser: 0.6 mg/dL (ref 0.50–1.35)
GFR calc Af Amer: >90 mL/min (ref >90)

## 2012-11-17 MED ORDER — LISINOPRIL 20 MG PO TABS: 20.0000 mg | ORAL_TABLET | Freq: Every day | ORAL | Status: DC

## 2012-11-17 MED ORDER — ADULT MULTIVITAMIN W/MINERALS CH: 1.0000 | ORAL_TABLET | Freq: Every day | ORAL | Status: DC

## 2012-11-17 MED ORDER — LISINOPRIL 20 MG PO TABS
20.0000 mg | ORAL_TABLET | Freq: Every day | ORAL | Status: DC
  Administered 2012-11-17: 20 mg via ORAL
  Filled 2012-11-17: qty 1

## 2012-11-17 MED ORDER — FOLIC ACID 1 MG PO TABS: 1.0000 mg | ORAL_TABLET | Freq: Every day | ORAL | Status: DC
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
## 2012-11-17 NOTE — Progress Notes (Signed)
MD notified about pt increased BP and orders placed and carried out. See medication order history and MAR. Pt BP now 159/95. Will continue to check BP's tonight. Pt resting in room.

## 2012-12-04 ENCOUNTER — Emergency Department (HOSPITAL_COMMUNITY)
Admission: EM | Admit: 2012-12-04 | Discharge: 2012-12-06 | Disposition: A | Discharge status: Home / Self Care | Payer: Self-pay | Attending: Emergency Medicine | Admitting: Emergency Medicine

## 2012-12-04 ENCOUNTER — Encounter (HOSPITAL_COMMUNITY): Payer: Self-pay | Admitting: Emergency Medicine

## 2012-12-04 DIAGNOSIS — I10 Essential (primary) hypertension: Secondary | ICD-10-CM | POA: Insufficient documentation

## 2012-12-04 DIAGNOSIS — F32A Depression, unspecified: Secondary | ICD-10-CM

## 2012-12-04 DIAGNOSIS — J4489 Other specified chronic obstructive pulmonary disease: Secondary | ICD-10-CM | POA: Insufficient documentation

## 2012-12-04 DIAGNOSIS — F1020 Alcohol dependence, uncomplicated: Secondary | ICD-10-CM

## 2012-12-04 DIAGNOSIS — Z87448 Personal history of other diseases of urinary system: Secondary | ICD-10-CM | POA: Insufficient documentation

## 2012-12-04 DIAGNOSIS — Z79899 Other long term (current) drug therapy: Secondary | ICD-10-CM | POA: Insufficient documentation

## 2012-12-04 DIAGNOSIS — R45851 Suicidal ideations: Secondary | ICD-10-CM

## 2012-12-04 DIAGNOSIS — Z5900 Homelessness unspecified: Secondary | ICD-10-CM

## 2012-12-04 DIAGNOSIS — F3289 Other specified depressive episodes: Secondary | ICD-10-CM | POA: Insufficient documentation

## 2012-12-04 DIAGNOSIS — F1010 Alcohol abuse, uncomplicated: Secondary | ICD-10-CM | POA: Insufficient documentation

## 2012-12-04 DIAGNOSIS — Z862 Personal history of diseases of the blood and blood-forming organs and certain disorders involving the immune mechanism: Secondary | ICD-10-CM | POA: Insufficient documentation

## 2012-12-04 DIAGNOSIS — F329 Major depressive disorder, single episode, unspecified: Secondary | ICD-10-CM | POA: Insufficient documentation

## 2012-12-04 DIAGNOSIS — F10929 Alcohol use, unspecified with intoxication, unspecified: Secondary | ICD-10-CM

## 2012-12-04 DIAGNOSIS — J449 Chronic obstructive pulmonary disease, unspecified: Secondary | ICD-10-CM | POA: Insufficient documentation

## 2012-12-04 DIAGNOSIS — F17200 Nicotine dependence, unspecified, uncomplicated: Secondary | ICD-10-CM | POA: Insufficient documentation

## 2012-12-04 DIAGNOSIS — Z8719 Personal history of other diseases of the digestive system: Secondary | ICD-10-CM | POA: Insufficient documentation

## 2012-12-04 HISTORY — DX: Depression, unspecified: F32.A

## 2012-12-04 HISTORY — DX: Major depressive disorder, single episode, unspecified: F32.9

## 2012-12-04 LAB — CBC WITH DIFFERENTIAL/PLATELET
HCT: 39 % (ref 39.0–52.0)
Hemoglobin: 12.8 g/dL — ABNORMAL LOW (ref 13.0–17.0)
Lymphs Abs: 3.4 10*3/uL (ref 0.7–4.0)
MCH: 26.7 pg (ref 26.0–34.0)
Monocytes Absolute: 0.7 10*3/uL (ref 0.1–1.0)
Monocytes Relative: 9 % (ref 3–12)
Neutro Abs: 3.4 10*3/uL (ref 1.7–7.7)
Neutrophils Relative %: 45 % (ref 43–77)
RBC: 4.79 MIL/uL (ref 4.22–5.81)

## 2012-12-04 LAB — BASIC METABOLIC PANEL
BUN: 6 mg/dL (ref 6–23)
Chloride: 106 mEq/L (ref 96–112)
Glucose, Bld: 86 mg/dL (ref 70–99)
Potassium: 4.6 mEq/L (ref 3.5–5.1)

## 2012-12-04 MED ORDER — ALUM & MAG HYDROXIDE-SIMETH 200-200-20 MG/5ML PO SUSP: 30.0000 mL | ORAL | Status: DC | PRN

## 2012-12-04 MED ORDER — ONDANSETRON HCL 4 MG PO TABS: 4.0000 mg | ORAL_TABLET | Freq: Three times a day (TID) | ORAL | Status: DC | PRN

## 2012-12-04 MED ORDER — IBUPROFEN 600 MG PO TABS: 600.0000 mg | ORAL_TABLET | Freq: Three times a day (TID) | ORAL | Status: DC | PRN

## 2012-12-04 MED ORDER — HYDROCORTISONE ACETATE 25 MG RE SUPP
25.0000 mg | Freq: Two times a day (BID) | RECTAL | Status: DC | PRN
  Filled 2012-12-04: qty 1

## 2012-12-04 MED ORDER — LORAZEPAM 1 MG PO TABS: 0.0000 mg | ORAL_TABLET | Freq: Two times a day (BID) | ORAL | Status: DC

## 2012-12-04 MED ORDER — THIAMINE HCL 100 MG/ML IJ SOLN
100.0000 mg | Freq: Every day | INTRAMUSCULAR | Status: DC
  Administered 2012-12-04: 100 mg via INTRAVENOUS
  Filled 2012-12-04: qty 2

## 2012-12-04 MED ORDER — LISINOPRIL 20 MG PO TABS
20.0000 mg | ORAL_TABLET | Freq: Every day | ORAL | Status: DC
  Administered 2012-12-05 – 2012-12-06 (×2): 20 mg via ORAL
  Filled 2012-12-04 (×3): qty 1

## 2012-12-04 MED ORDER — ZIPRASIDONE MESYLATE 20 MG IM SOLR
10.0000 mg | Freq: Once | INTRAMUSCULAR | Status: AC
  Administered 2012-12-04: 10 mg via INTRAMUSCULAR
  Filled 2012-12-04: qty 20

## 2012-12-04 MED ORDER — SODIUM CHLORIDE 0.9 % IV BOLUS (SEPSIS)
1000.0000 mL | INTRAVENOUS | Status: AC
  Administered 2012-12-04: 1000 mL via INTRAVENOUS

## 2012-12-04 MED ORDER — NICOTINE 21 MG/24HR TD PT24: 21.0000 mg | MEDICATED_PATCH | Freq: Every day | TRANSDERMAL | Status: DC

## 2012-12-04 MED ORDER — FOLIC ACID 1 MG PO TABS
1.0000 mg | ORAL_TABLET | Freq: Every day | ORAL | Status: DC
  Administered 2012-12-05 – 2012-12-06 (×3): 1 mg via ORAL
  Filled 2012-12-04 (×3): qty 1

## 2012-12-04 MED ORDER — LORAZEPAM 2 MG/ML IJ SOLN: 1.0000 mg | Freq: Four times a day (QID) | INTRAMUSCULAR | Status: DC | PRN

## 2012-12-04 MED ORDER — ADULT MULTIVITAMIN W/MINERALS CH
1.0000 | ORAL_TABLET | Freq: Every day | ORAL | Status: DC
  Administered 2012-12-05 – 2012-12-06 (×3): 1 via ORAL
  Filled 2012-12-04 (×3): qty 1

## 2012-12-04 MED ORDER — LORAZEPAM 1 MG PO TABS
0.0000 mg | ORAL_TABLET | Freq: Four times a day (QID) | ORAL | Status: DC
  Administered 2012-12-05 – 2012-12-06 (×4): 1 mg via ORAL
  Filled 2012-12-04 (×3): qty 1

## 2012-12-04 MED ORDER — ACETAMINOPHEN 325 MG PO TABS
650.0000 mg | ORAL_TABLET | ORAL | Status: DC | PRN
  Administered 2012-12-05 (×2): 650 mg via ORAL
  Filled 2012-12-04 (×2): qty 2

## 2012-12-04 MED ORDER — LORAZEPAM 1 MG PO TABS
1.0000 mg | ORAL_TABLET | Freq: Four times a day (QID) | ORAL | Status: DC | PRN
  Filled 2012-12-04: qty 1

## 2012-12-04 MED ORDER — VITAMIN B-1 100 MG PO TABS
100.0000 mg | ORAL_TABLET | Freq: Every day | ORAL | Status: DC
  Administered 2012-12-05 – 2012-12-06 (×2): 100 mg via ORAL
  Filled 2012-12-04 (×2): qty 1
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
## 2012-12-04 NOTE — ED Notes (Signed)
Pt's casework from [facility] is concerned about the pt's SI.  Pt is currently intoxicated and we are unable to assess whether the pt is actually SI.  Pt's casework states that he has made SI comments prior to becoming intoxicated.  Casework also states that pt has a friend that recently committed suicide, which the caseworker thinks puts the pt at a higher risk for suicide.  Tiffany, PA has been made aware. RN will continue to monitor pt.  Pt currently has a sitter at bedside.
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
## 2012-12-04 NOTE — ED Notes (Signed)
Carmelina Noun, Director of Street [employer] stage manager: [phone].

## 2012-12-04 NOTE — ED Provider Notes (Signed)
MSE was initiated and I personally evaluated the patient and placed orders (if any) at  7:36 PM on December 04, 2012.  The patient appears stable so that the remainder of the MSE may be completed by another provider.  Per caseworker: This is a 52 year old male, who presents emergency department with chief complaint of alcohol intoxication. Patient reportedly called his caseworker, because he was feeling depressed and was complaining of suicidal ideation. Patient states that he wanted to walk in front of a train. Patient also complains of depression, which is been worsening over the past several weeks. He also wishes to go through detox.  I'm uncertain about the detox and suicidal ideation, as the patient is also intoxicated, he is unable to communicate clearly. I have briefly discussed the patient with Dr. Hyacinth Meeker, who tells me that the patient will need to sober up before he is able to be evaluated for SI/detox.  ROS: Deferred 2/2 intoxication  PE: Gen: A&O x4 HEENT: PERRL, EOM intact CHEST: RRR, no m/r/g LUNGS: CTAB, no w/r/r ABD: BS x 4, ND/NT EXT: No edema, strong peripheral pulses NEURO: Sensation and strength intact bilaterally  Assessment: Alcohol intoxication  Plan: Allow the patient to sober up, getting fluids, re-evaluate.   Roxy Horseman, PA-C 12/04/12 2038

## 2012-12-04 NOTE — ED Notes (Signed)
Pt currently sleeping

## 2012-12-05 ENCOUNTER — Encounter (HOSPITAL_COMMUNITY): Payer: Self-pay | Admitting: *Deleted

## 2012-12-05 LAB — ETHANOL: Alcohol, Ethyl (B): 225 mg/dL — ABNORMAL HIGH (ref 0–11)

## 2012-12-05 LAB — RAPID URINE DRUG SCREEN, HOSP PERFORMED
Amphetamines: NOT DETECTED
Barbiturates: NOT DETECTED
Benzodiazepines: NOT DETECTED
Cocaine: NOT DETECTED
Tetrahydrocannabinol: NOT DETECTED

## 2012-12-05 MED ORDER — LORAZEPAM 2 MG/ML IJ SOLN
1.0000 mg | Freq: Once | INTRAMUSCULAR | Status: AC
  Administered 2012-12-05: 1 mg via INTRAVENOUS
  Filled 2012-12-05: qty 1

## 2012-12-05 NOTE — ED Provider Notes (Signed)
Medical screening examination/treatment/procedure(s) were performed by non-physician practitioner and as supervising physician I was immediately available for consultation/collaboration.   Flint Melter, MD 12/05/12 [phone]
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
## 2012-12-05 NOTE — ED Provider Notes (Signed)
Case signed out from [place] Neva Seat at shift change: Terry Bradley is a 52 y.o. male brought in by social work for suicidal ideation and alcohol intoxication. Patient has had several friends commit suicide by jumping in front of trains over the last several months. He states that he would like to do the same. Patient is found to be intoxicated and has had Questionable prior detox reaction with either tremors versus seizure. Patient has become tachycardic and agitated as responded to Geodon and Ativan.  The patient remained calm throughout the day. Case signed out to Dr. Effie Shy at shift change.  Wynetta Emery, PA-C 12/06/12 1627

## 2012-12-05 NOTE — Progress Notes (Signed)
CSW met with pt, streetwatch and rn case manager at pt bedside. Pt is motivated to complete detox and follow up with [facility] on Wednesday where pt already has an appointment. Per discussion with pt and streetwatch patient is priority for available housing with supportive mental health services once completing detox. CSW will complete bhh assessment and assist with finding detox placement for patient.   Catha Gosselin, LCSWA  [phone] .12/05/2012 20:13pm

## 2012-12-05 NOTE — Progress Notes (Signed)
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  ACT member reviewed and provided written information for outpatient rehab/detox programs [facility], [facility], [facility], etc Pt prefers to wait to speak to "Rosanna Randy"  1905 Cm spoke with CM leadership on call about EDP request for medication assistance for this homeless self pay pt with no listed insurance coverage.  Assistance available is the [facility] program for Librium 25 mg q 6 hrs total 20 tabs but pt would have to return to [facility] outpatient pharmacy to receive medication assistance on 12/06/12.  ED SW updated via voice message at 1915 At 1931 CM left a voice message for [place] at [phone] to return a call to pt or EDP at [phone].  Beverly left this message after Cm found a number for Toma Aran at [place] at [phone] which referred anyone inquiring about street watch to dial because the [phone] phone is "broken" per the outgoing voice message.  Pending a return call

## 2012-12-05 NOTE — ED Notes (Signed)
Patient asleep; no s/s of distress. Vitals held per protocol.
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
## 2012-12-05 NOTE — ED Notes (Signed)
Patient with body odor and disheveled in appearance. Pt provided with new paper scrubs, body wash and towels. Pt currently taking a shower at this time. No s/s of distress noted.

## 2012-12-05 NOTE — ED Notes (Signed)
Pt was asked if he had any thoughts of hurting himself and pt states "I've thought about it a time or two." When asked if he wanted to hurt himself at this moment, pt states "I can't tell you."  Pt also asked if he has a plan and pt states, "There's always ways."  Plan of care was also discussed with Tiffany, the PA, and the PA states that we are keeping in pt in the ER d/t the high ETOH level and risk of withdrawal symptoms.  Charge RN made aware.

## 2012-12-05 NOTE — Progress Notes (Signed)
Terry Bradley and Terry Bradley arrived to [facility] ED to review with pt his available resources Pt choice of attempt for [facility] placement for detox No MATCH letter needed at this time.  ED SW to complete [facility] assessment for pt ED CM signing off

## 2012-12-06 DIAGNOSIS — F1020 Alcohol dependence, uncomplicated: Secondary | ICD-10-CM

## 2012-12-06 LAB — CBC WITH DIFFERENTIAL/PLATELET
Basophils Relative: 0 % (ref 0–1)
Eosinophils Absolute: 0.1 10*3/uL (ref 0.0–0.7)
Hemoglobin: 11.2 g/dL — ABNORMAL LOW (ref 13.0–17.0)
MCH: 26.5 pg (ref 26.0–34.0)
MCHC: 32.3 g/dL (ref 30.0–36.0)
Monocytes Absolute: 0.9 10*3/uL (ref 0.1–1.0)
Monocytes Relative: 9 % (ref 3–12)
Neutrophils Relative %: 70 % (ref 43–77)

## 2012-12-06 LAB — ETHANOL: Alcohol, Ethyl (B): <11 mg/dL (ref 0–11)

## 2012-12-06 LAB — HEPATIC FUNCTION PANEL
ALT: 21 U/L (ref 0–53)
AST: 38 U/L — ABNORMAL HIGH (ref 0–37)
Bilirubin, Direct: <0.1 mg/dL (ref 0.0–0.3)

## 2012-12-06 NOTE — BHH Counselor (Signed)
Called [facility] and was told that the pt needed an updated cbc and bac, was advised that the bac needed to be less than 200 to be accepted to treatment. EDP will order new bac for current levels. Ranae Pila, LCAS

## 2012-12-06 NOTE — Progress Notes (Signed)
CM and ACT member consulted about plan of care for pt.  CM also asked ACT to contact ED SW also

## 2012-12-06 NOTE — ED Provider Notes (Addendum)
  Filed Vitals:   12/06/12 0635  BP: 147/98  Pulse: 72  Temp:   Resp:     Pt assessed this am. Pleasant and cooperative. No acute issues overnight. [facility] reviewing.   Raeford Razor, MD 12/06/12 [phone]  [facility] tentatively accepted pt pending repeat etoh lower than 200.   Raeford Razor, MD 12/06/12 1155  Is now requesting to leave. Patient has been here for approximately 2 days. Multiple efforts made to place patient for detox. Patient has now been accepted at [facility], but he is declining to go. Requesting to be discharged. He is declining suicidality at this point. He states that he feels safe to go home. Patient will be given outpatient resources.  Raeford Razor, MD 12/06/12 [phone]

## 2012-12-06 NOTE — Progress Notes (Signed)
[facility] ED CM provided a referral to partnership for community care liaison , Dolora on 12/05/12 Pt sent to [facility] to follow for transition  

## 2012-12-06 NOTE — ED Notes (Signed)
Lab notified of new orders. 

## 2012-12-06 NOTE — ED Notes (Signed)
Lab notified of new orders; lab states will come to Psych ED as soon as they are able.

## 2012-12-06 NOTE — ED Provider Notes (Signed)
Patient seen by me. Evaluated at 1700 hours, 12/05/12. Patient was sober, cooperative. He has received Ativan several times to control his withdrawal symptoms. He, states that he wants long-term inpatient rehabilitation. He has been seen in the emergency department by a community team. They are working to help him get rehabilitation. They see the best way forward, as to admit him to the behavioral health hospital and then entering a long-term treatment program. ACT agrees with this plan and will see the patient and attempt to place him   Medical screening examination/treatment/procedure(s) were conducted as a shared visit with non-physician practitioner(s) and myself.  I personally evaluated the patient during the encounter  Flint Melter, MD 12/06/12 [phone]

## 2012-12-06 NOTE — ED Provider Notes (Signed)
Ordered hepatic fxn panel per [facility] request, then ordered repeat CBC per [facility] request after [facility] received liver test results and then added request for repeat CBC. 0550  ACT trying to send patient to [facility].  Hurman Horn, MD 12/11/12 [phone]
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
## 2012-12-06 NOTE — ED Notes (Signed)
4 bags and one backpack sent w/patient personal belongings

## 2012-12-06 NOTE — Progress Notes (Signed)
ED CM consulted with Irving Burton in [facility] pharmacy [phone] about medication assistance for homeless pt going to [facility] and informed assistance will only be given to homeless pt for 14 days if medication is not a narcotic Will not be able to provide pt with 14 day of ativan nor librium for [facility] d/c

## 2012-12-07 NOTE — ED Provider Notes (Signed)
Medical screening examination/treatment/procedure(s) were performed by non-physician practitioner and as supervising physician I was immediately available for consultation/collaboration.   Joyanna Kleman L Rozalynn Buege, MD 12/07/12 0026 

## 2013-01-25 ENCOUNTER — Encounter (HOSPITAL_COMMUNITY): Payer: Self-pay | Admitting: *Deleted

## 2013-01-25 ENCOUNTER — Emergency Department (HOSPITAL_COMMUNITY)
Admission: EM | Admit: 2013-01-25 | Discharge: 2013-01-25 | Disposition: A | Discharge status: Home / Self Care | Payer: Self-pay | Attending: Emergency Medicine | Admitting: Emergency Medicine

## 2013-01-25 DIAGNOSIS — Z5900 Homelessness unspecified: Secondary | ICD-10-CM | POA: Insufficient documentation

## 2013-01-25 DIAGNOSIS — I10 Essential (primary) hypertension: Secondary | ICD-10-CM | POA: Insufficient documentation

## 2013-01-25 DIAGNOSIS — M25571 Pain in right ankle and joints of right foot: Secondary | ICD-10-CM

## 2013-01-25 DIAGNOSIS — Z79899 Other long term (current) drug therapy: Secondary | ICD-10-CM | POA: Insufficient documentation

## 2013-01-25 DIAGNOSIS — Z8719 Personal history of other diseases of the digestive system: Secondary | ICD-10-CM | POA: Insufficient documentation

## 2013-01-25 DIAGNOSIS — G8929 Other chronic pain: Secondary | ICD-10-CM | POA: Insufficient documentation

## 2013-01-25 DIAGNOSIS — Z8659 Personal history of other mental and behavioral disorders: Secondary | ICD-10-CM | POA: Insufficient documentation

## 2013-01-25 DIAGNOSIS — M549 Dorsalgia, unspecified: Secondary | ICD-10-CM | POA: Insufficient documentation

## 2013-01-25 DIAGNOSIS — Z862 Personal history of diseases of the blood and blood-forming organs and certain disorders involving the immune mechanism: Secondary | ICD-10-CM | POA: Insufficient documentation

## 2013-01-25 DIAGNOSIS — M25579 Pain in unspecified ankle and joints of unspecified foot: Secondary | ICD-10-CM | POA: Insufficient documentation

## 2013-01-25 DIAGNOSIS — R45851 Suicidal ideations: Secondary | ICD-10-CM | POA: Insufficient documentation

## 2013-01-25 DIAGNOSIS — F17200 Nicotine dependence, unspecified, uncomplicated: Secondary | ICD-10-CM | POA: Insufficient documentation

## 2013-01-25 DIAGNOSIS — J4489 Other specified chronic obstructive pulmonary disease: Secondary | ICD-10-CM | POA: Insufficient documentation

## 2013-01-25 DIAGNOSIS — J449 Chronic obstructive pulmonary disease, unspecified: Secondary | ICD-10-CM | POA: Insufficient documentation

## 2013-01-25 MED ORDER — OXYCODONE-ACETAMINOPHEN 5-325 MG PO TABS
2.0000 | ORAL_TABLET | Freq: Once | ORAL | Status: AC
  Administered 2013-01-25: 2 via ORAL
  Filled 2013-01-25: qty 2

## 2013-01-25 MED ORDER — OXYCODONE-ACETAMINOPHEN 5-325 MG PO TABS: 1.0000 | ORAL_TABLET | Freq: Four times a day (QID) | ORAL | Status: DC | PRN
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
## 2013-01-25 NOTE — ED Notes (Addendum)
Pt states pain for 2 weeks in his right leg. Pt states surgery 3 years ago and pins removed 2 weeks ago. Pt states [facility]reon aware of his pain. MD Handy gave him rx for percocet. Pt states that he has run out of the percocet and pain is really bad. Pt states back pain as well lower back across his back (pt cracked vertebra in lower back) pt ambulatroy

## 2013-01-30 NOTE — ED Provider Notes (Signed)
Medical screening examination/treatment/procedure(s) were performed by non-physician practitioner and as supervising physician I was immediately available for consultation/collaboration.   Tylerjames Hoglund E Karla Vines, MD 01/30/13 1401 

## 2013-01-31 ENCOUNTER — Emergency Department (HOSPITAL_COMMUNITY)
Admission: EM | Admit: 2013-01-31 | Discharge: 2013-02-01 | Disposition: A | Discharge status: Home / Self Care | Payer: Self-pay

## 2013-01-31 ENCOUNTER — Emergency Department (HOSPITAL_COMMUNITY): Payer: Self-pay

## 2013-01-31 ENCOUNTER — Encounter (HOSPITAL_COMMUNITY): Payer: Self-pay | Admitting: Emergency Medicine

## 2013-01-31 DIAGNOSIS — Z5900 Homelessness unspecified: Secondary | ICD-10-CM | POA: Insufficient documentation

## 2013-01-31 DIAGNOSIS — J4489 Other specified chronic obstructive pulmonary disease: Secondary | ICD-10-CM | POA: Insufficient documentation

## 2013-01-31 DIAGNOSIS — F1010 Alcohol abuse, uncomplicated: Secondary | ICD-10-CM | POA: Insufficient documentation

## 2013-01-31 DIAGNOSIS — Z79899 Other long term (current) drug therapy: Secondary | ICD-10-CM | POA: Insufficient documentation

## 2013-01-31 DIAGNOSIS — F10929 Alcohol use, unspecified with intoxication, unspecified: Secondary | ICD-10-CM

## 2013-01-31 DIAGNOSIS — J449 Chronic obstructive pulmonary disease, unspecified: Secondary | ICD-10-CM | POA: Insufficient documentation

## 2013-01-31 DIAGNOSIS — Z8719 Personal history of other diseases of the digestive system: Secondary | ICD-10-CM | POA: Insufficient documentation

## 2013-01-31 DIAGNOSIS — R45851 Suicidal ideations: Secondary | ICD-10-CM | POA: Insufficient documentation

## 2013-01-31 DIAGNOSIS — Z8659 Personal history of other mental and behavioral disorders: Secondary | ICD-10-CM | POA: Insufficient documentation

## 2013-01-31 DIAGNOSIS — F17200 Nicotine dependence, unspecified, uncomplicated: Secondary | ICD-10-CM | POA: Insufficient documentation

## 2013-01-31 DIAGNOSIS — I10 Essential (primary) hypertension: Secondary | ICD-10-CM | POA: Insufficient documentation

## 2013-01-31 DIAGNOSIS — R51 Headache: Secondary | ICD-10-CM | POA: Insufficient documentation

## 2013-01-31 DIAGNOSIS — Z862 Personal history of diseases of the blood and blood-forming organs and certain disorders involving the immune mechanism: Secondary | ICD-10-CM | POA: Insufficient documentation

## 2013-01-31 LAB — CBC
MCH: 27.2 pg (ref 26.0–34.0)
MCHC: 33.9 g/dL (ref 30.0–36.0)
Platelets: 333 10*3/uL (ref 150–400)
RBC: 5.11 MIL/uL (ref 4.22–5.81)

## 2013-01-31 LAB — COMPREHENSIVE METABOLIC PANEL
ALT: 33 U/L (ref 0–53)
AST: 41 U/L — ABNORMAL HIGH (ref 0–37)
Calcium: 8.9 mg/dL (ref 8.4–10.5)
Sodium: 141 mEq/L (ref 135–145)
Total Protein: 8.4 g/dL — ABNORMAL HIGH (ref 6.0–8.3)

## 2013-01-31 MED ORDER — LORAZEPAM 1 MG PO TABS
1.0000 mg | ORAL_TABLET | Freq: Four times a day (QID) | ORAL | Status: DC | PRN
  Administered 2013-01-31: 1 mg via ORAL
  Filled 2013-01-31: qty 1

## 2013-01-31 MED ORDER — THIAMINE HCL 100 MG/ML IJ SOLN: 100.0000 mg | Freq: Every day | INTRAMUSCULAR | Status: DC

## 2013-01-31 MED ORDER — ADULT MULTIVITAMIN W/MINERALS CH
1.0000 | ORAL_TABLET | Freq: Every day | ORAL | Status: DC
  Administered 2013-01-31 – 2013-02-01 (×2): 1 via ORAL
  Filled 2013-01-31 (×2): qty 1

## 2013-01-31 MED ORDER — FOLIC ACID 1 MG PO TABS
1.0000 mg | ORAL_TABLET | Freq: Every day | ORAL | Status: DC
  Administered 2013-01-31 – 2013-02-01 (×2): 1 mg via ORAL
  Filled 2013-01-31 (×2): qty 1

## 2013-01-31 MED ORDER — VITAMIN B-1 100 MG PO TABS
100.0000 mg | ORAL_TABLET | Freq: Every day | ORAL | Status: DC
  Administered 2013-01-31 – 2013-02-01 (×2): 100 mg via ORAL
  Filled 2013-01-31 (×2): qty 1

## 2013-01-31 MED ORDER — LORAZEPAM 2 MG/ML IJ SOLN: 1.0000 mg | Freq: Four times a day (QID) | INTRAMUSCULAR | Status: DC | PRN
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
## 2013-01-31 NOTE — ED Notes (Signed)
Per telephone conversation pt cannot be seen by act team until ETOH is decreased and UDS resulted.

## 2013-01-31 NOTE — ED Notes (Signed)
Patient transported to CT 
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
## 2013-01-31 NOTE — ED Notes (Signed)
Pt changed into blue scrubs.  Pt and belongings wanded by security.
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
## 2013-02-01 LAB — RAPID URINE DRUG SCREEN, HOSP PERFORMED
Amphetamines: NOT DETECTED
Barbiturates: NOT DETECTED
Benzodiazepines: NOT DETECTED

## 2013-02-01 LAB — ETHANOL: Alcohol, Ethyl (B): 247 mg/dL — ABNORMAL HIGH (ref 0–11)

## 2013-02-01 MED ORDER — SODIUM CHLORIDE 0.9 % IV BOLUS (SEPSIS)
1000.0000 mL | Freq: Once | INTRAVENOUS | Status: AC
  Administered 2013-02-01: 1000 mL via INTRAVENOUS

## 2013-02-01 NOTE — ED Provider Notes (Signed)
Medical screening examination/treatment/procedure(s) were performed by non-physician practitioner and as supervising physician I was immediately available for consultation/collaboration.   Gwyneth Sprout, MD 02/01/13 [phone]

## 2013-02-01 NOTE — ED Provider Notes (Signed)
Received sign out for this patient from [place], [place].  Plan at sign out is to let patient sober up and then discharge.  9:41 AM Results for orders placed during the hospital encounter of 01/31/13  ETHANOL      Result Value Range   Alcohol, Ethyl (B) 438 (*) 0 - 11 mg/dL  CBC      Result Value Range   WBC 8.1  4.0 - 10.5 K/uL   RBC 5.11  4.22 - 5.81 MIL/uL   Hemoglobin 13.9  13.0 - 17.0 g/dL   HCT 16.1  09.6 - 04.5 %   MCV 80.2  78.0 - 100.0 fL   MCH 27.2  26.0 - 34.0 pg   MCHC 33.9  30.0 - 36.0 g/dL   RDW 40.9 (*) 81.1 - 91.4 %   Platelets 333  150 - 400 K/uL  COMPREHENSIVE METABOLIC PANEL      Result Value Range   Sodium 141  135 - 145 mEq/L   Potassium 4.0  3.5 - 5.1 mEq/L   Chloride 104  96 - 112 mEq/L   CO2 25  19 - 32 mEq/L   Glucose, Bld 92  70 - 99 mg/dL   BUN 9  6 - 23 mg/dL   Creatinine, Ser 7.82  0.50 - 1.35 mg/dL   Calcium 8.9  8.4 - 95.6 mg/dL   Total Protein 8.4 (*) 6.0 - 8.3 g/dL   Albumin 3.8  3.5 - 5.2 g/dL   AST 41 (*) 0 - 37 U/L   ALT 33  0 - 53 U/L   Alkaline Phosphatase 86  39 - 117 U/L   Total Bilirubin 0.1 (*) 0.3 - 1.2 mg/dL   GFR calc non Af Amer >90  >90 mL/min   GFR calc Af Amer >90  >90 mL/min  URINE RAPID DRUG SCREEN (HOSP PERFORMED)      Result Value Range   Opiates NONE DETECTED  NONE DETECTED   Cocaine NONE DETECTED  NONE DETECTED   Benzodiazepines NONE DETECTED  NONE DETECTED   Amphetamines NONE DETECTED  NONE DETECTED   Tetrahydrocannabinol NONE DETECTED  NONE DETECTED   Barbiturates NONE DETECTED  NONE DETECTED  ETHANOL      Result Value Range   Alcohol, Ethyl (B) 247 (*) 0 - 11 mg/dL  ETHANOL      Result Value Range   Alcohol, Ethyl (B) 166 (*) 0 - 11 mg/dL   Ct Head Wo Contrast  01/31/2013  *RADIOLOGY REPORT*  Clinical Data: Headache.  CT HEAD WITHOUT CONTRAST  Technique:  Contiguous axial images were obtained from the base of the skull through the vertex without contrast.  Comparison: 02/18/2010  Findings: There is no acute  intracranial hemorrhage, infarction, or mass lesion.  Brain parenchyma is normal.  Osseous structures are normal.  Slight chronic mucosal thickening in the maxillary sinuses, unchanged.  IMPRESSION: No acute abnormality.   Original Report Authenticated By: Francene Boyers, M.D.    Ct Maxillofacial Wo Cm  01/31/2013  *RADIOLOGY REPORT*  Clinical Data: Headache.  CT MAXILLOFACIAL WITHOUT CONTRAST  Technique:  Multidetector CT imaging of the maxillofacial structures was performed. Multiplanar CT image reconstructions were also generated.  Comparison: CT scan dated 11/22/2011  Findings: There is slight chronic mucosal thickening in the maxillary sinuses and anterior ethmoid air cells, unchanged.  There is also chronic.  Mild disease involving the incisors in the mandible, also unchanged.  No soft tissue abnormality.  IMPRESSION: No acute abnormalities.   Original Report  Authenticated By: Francene Boyers, M.D.      Patient is alert and oriented.  Not in any apparent distress.  Etoh level is 166.  Patient can ambulate without difficulty.  Will discharge the patient to home.  Recommend PCP follow-up.  Patient is stable and ready for discharge.  Roxy Horseman, PA-C 02/01/13 [phone]

## 2013-02-01 NOTE — ED Provider Notes (Signed)
Medical screening examination/treatment/procedure(s) were performed by non-physician practitioner and as supervising physician I was immediately available for consultation/collaboration.   Nelia Shi, MD 02/01/13 [phone]

## 2013-04-07 ENCOUNTER — Encounter (HOSPITAL_COMMUNITY): Payer: Self-pay | Admitting: Emergency Medicine

## 2013-04-07 ENCOUNTER — Emergency Department (HOSPITAL_COMMUNITY)
Admission: EM | Admit: 2013-04-07 | Discharge: 2013-04-07 | Disposition: A | Discharge status: Home / Self Care | Payer: Self-pay | Attending: Emergency Medicine | Admitting: Emergency Medicine

## 2013-04-07 DIAGNOSIS — Z5900 Homelessness unspecified: Secondary | ICD-10-CM | POA: Insufficient documentation

## 2013-04-07 DIAGNOSIS — M549 Dorsalgia, unspecified: Secondary | ICD-10-CM | POA: Insufficient documentation

## 2013-04-07 DIAGNOSIS — M79609 Pain in unspecified limb: Secondary | ICD-10-CM | POA: Insufficient documentation

## 2013-04-07 DIAGNOSIS — Z880 Allergy status to penicillin: Secondary | ICD-10-CM | POA: Insufficient documentation

## 2013-04-07 DIAGNOSIS — Z862 Personal history of diseases of the blood and blood-forming organs and certain disorders involving the immune mechanism: Secondary | ICD-10-CM | POA: Insufficient documentation

## 2013-04-07 DIAGNOSIS — I10 Essential (primary) hypertension: Secondary | ICD-10-CM | POA: Insufficient documentation

## 2013-04-07 DIAGNOSIS — Z8719 Personal history of other diseases of the digestive system: Secondary | ICD-10-CM | POA: Insufficient documentation

## 2013-04-07 DIAGNOSIS — J4489 Other specified chronic obstructive pulmonary disease: Secondary | ICD-10-CM | POA: Insufficient documentation

## 2013-04-07 DIAGNOSIS — J449 Chronic obstructive pulmonary disease, unspecified: Secondary | ICD-10-CM | POA: Insufficient documentation

## 2013-04-07 DIAGNOSIS — Z9889 Other specified postprocedural states: Secondary | ICD-10-CM | POA: Insufficient documentation

## 2013-04-07 DIAGNOSIS — F17200 Nicotine dependence, unspecified, uncomplicated: Secondary | ICD-10-CM | POA: Insufficient documentation

## 2013-04-07 DIAGNOSIS — Z79899 Other long term (current) drug therapy: Secondary | ICD-10-CM | POA: Insufficient documentation

## 2013-04-07 DIAGNOSIS — Z8659 Personal history of other mental and behavioral disorders: Secondary | ICD-10-CM | POA: Insufficient documentation

## 2013-04-07 MED ORDER — MORPHINE SULFATE 4 MG/ML IJ SOLN
6.0000 mg | Freq: Once | INTRAMUSCULAR | Status: AC
  Administered 2013-04-07: 6 mg via INTRAVENOUS
  Filled 2013-04-07: qty 1

## 2013-04-07 MED ORDER — SODIUM CHLORIDE 0.9 % IV BOLUS (SEPSIS)
1000.0000 mL | Freq: Once | INTRAVENOUS | Status: AC
  Administered 2013-04-07: 1000 mL via INTRAVENOUS

## 2013-04-07 MED ORDER — LORAZEPAM 2 MG/ML IJ SOLN
0.5000 mg | Freq: Once | INTRAMUSCULAR | Status: AC
  Administered 2013-04-07: 2 mg via INTRAVENOUS
  Filled 2013-04-07: qty 1

## 2013-04-07 MED ORDER — ONDANSETRON HCL 4 MG/2ML IJ SOLN
4.0000 mg | Freq: Once | INTRAMUSCULAR | Status: AC
Start: 2013-04-07 — End: 2013-04-07
  Administered 2013-04-07: 4 mg via INTRAVENOUS
  Filled 2013-04-07: qty 2
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
## 2013-04-07 NOTE — ED Notes (Signed)
Pt refused to wait on paper work and e-sign. Was asking for pain med prescription for chronic pain.

## 2013-04-07 NOTE — ED Notes (Signed)
Pt here for c/o bp being as well as back pain and leg pain

## 2013-07-31 ENCOUNTER — Ambulatory Visit: Payer: No Typology Code available for payment source | Attending: Internal Medicine

## 2013-09-17 ENCOUNTER — Ambulatory Visit: Payer: No Typology Code available for payment source | Attending: Internal Medicine | Admitting: Internal Medicine

## 2013-09-17 ENCOUNTER — Encounter: Payer: Self-pay | Admitting: Internal Medicine

## 2013-09-17 VITALS — BP 133/94 | HR 98 | Temp 98.8°F | Resp 16 | Ht 67.0 in | Wt 143.0 lb

## 2013-09-17 DIAGNOSIS — F17200 Nicotine dependence, unspecified, uncomplicated: Secondary | ICD-10-CM

## 2013-09-17 DIAGNOSIS — J4489 Other specified chronic obstructive pulmonary disease: Secondary | ICD-10-CM | POA: Insufficient documentation

## 2013-09-17 DIAGNOSIS — R222 Localized swelling, mass and lump, trunk: Secondary | ICD-10-CM

## 2013-09-17 DIAGNOSIS — J449 Chronic obstructive pulmonary disease, unspecified: Secondary | ICD-10-CM

## 2013-09-17 DIAGNOSIS — I10 Essential (primary) hypertension: Secondary | ICD-10-CM | POA: Insufficient documentation

## 2013-09-17 DIAGNOSIS — F411 Generalized anxiety disorder: Secondary | ICD-10-CM

## 2013-09-17 DIAGNOSIS — Z5900 Homelessness unspecified: Secondary | ICD-10-CM | POA: Insufficient documentation

## 2013-09-17 DIAGNOSIS — Z720 Tobacco use: Secondary | ICD-10-CM | POA: Insufficient documentation

## 2013-09-17 DIAGNOSIS — R918 Other nonspecific abnormal finding of lung field: Secondary | ICD-10-CM | POA: Insufficient documentation

## 2013-09-17 DIAGNOSIS — F32A Depression, unspecified: Secondary | ICD-10-CM | POA: Insufficient documentation

## 2013-09-17 DIAGNOSIS — M25579 Pain in unspecified ankle and joints of unspecified foot: Secondary | ICD-10-CM | POA: Insufficient documentation

## 2013-09-17 DIAGNOSIS — M25571 Pain in right ankle and joints of right foot: Secondary | ICD-10-CM | POA: Insufficient documentation

## 2013-09-17 DIAGNOSIS — F329 Major depressive disorder, single episode, unspecified: Secondary | ICD-10-CM

## 2013-09-17 LAB — CBC WITH DIFFERENTIAL/PLATELET
Eosinophils Relative: 3 % (ref 0–5)
Lymphocytes Relative: 28 % (ref 12–46)
Lymphs Abs: 2.4 10*3/uL (ref 0.7–4.0)
MCV: 88.2 fL (ref 78.0–100.0)
Neutro Abs: 5.3 10*3/uL (ref 1.7–7.7)
Platelets: 353 10*3/uL (ref 150–400)
RBC: 4.23 MIL/uL (ref 4.22–5.81)
WBC: 8.8 10*3/uL (ref 4.0–10.5)

## 2013-09-17 LAB — CMP AND LIVER
AST: 22 U/L (ref 0–37)
Alkaline Phosphatase: 68 U/L (ref 39–117)
BUN: 8 mg/dL (ref 6–23)
Calcium: 9.7 mg/dL (ref 8.4–10.5)
Chloride: 98 mEq/L (ref 96–112)
Creat: 0.73 mg/dL (ref 0.50–1.35)
Indirect Bilirubin: 0.1 mg/dL (ref 0.0–0.9)

## 2013-09-17 LAB — LIPID PANEL
Cholesterol: 173 mg/dL (ref 0–200)
HDL: 44 mg/dL (ref >39)
Total CHOL/HDL Ratio: 3.9 Ratio
Triglycerides: 164 mg/dL — ABNORMAL HIGH (ref <150)

## 2013-09-17 MED ORDER — TRAZODONE HCL 150 MG PO TABS: 150.0000 mg | ORAL_TABLET | Freq: Every day | ORAL | Status: DC

## 2013-09-17 MED ORDER — ARIPIPRAZOLE 10 MG PO TABS: 10.0000 mg | ORAL_TABLET | Freq: Every day | ORAL | Status: DC

## 2013-09-17 MED ORDER — CLONAZEPAM 0.5 MG PO TABS: 0.5000 mg | ORAL_TABLET | Freq: Two times a day (BID) | ORAL | Status: DC | PRN

## 2013-09-17 MED ORDER — TRAMADOL HCL 50 MG PO TABS: 50.0000 mg | ORAL_TABLET | Freq: Four times a day (QID) | ORAL | Status: DC | PRN

## 2013-09-17 MED ORDER — LISINOPRIL 20 MG PO TABS: 20.0000 mg | ORAL_TABLET | Freq: Every morning | ORAL | Status: DC

## 2013-09-17 MED ORDER — AMITRIPTYLINE HCL 50 MG PO TABS: 50.0000 mg | ORAL_TABLET | Freq: Every day | ORAL | Status: DC

## 2013-09-17 NOTE — Patient Instructions (Signed)
Generalized Anxiety Disorder Generalized anxiety disorder (GAD) is a mental disorder. It interferes with life functions, including relationships, work, and school. GAD is different from normal anxiety, which everyone experiences at some point in their lives in response to specific life events and activities. Normal anxiety actually helps [place] prepare for and get through these life events and activities. Normal anxiety goes away after the event or activity is over.  GAD causes anxiety that is not necessarily related to specific events or activities. It also causes excess anxiety in proportion to specific events or activities. The anxiety associated with GAD is also difficult to control. GAD can vary from mild to severe. People with severe GAD can have intense waves of anxiety with physical symptoms (panic attacks).  SYMPTOMS The anxiety and worry associated with GAD are difficult to control. This anxiety and worry are related to many life events and activities and also occur more days than not for 6 months or longer. People with GAD also have three or more of the following symptoms (one or more in children):  Restlessness.   Fatigue.  Difficulty concentrating.   Irritability.  Muscle tension.  Difficulty sleeping or unsatisfying sleep. DIAGNOSIS GAD is diagnosed through an assessment by your caregiver. Your caregiver will ask you questions aboutyour mood,physical symptoms, and events in your life. Your caregiver may ask you about your medical history and use of alcohol or drugs, including prescription medications. Your caregiver may also do a physical exam and blood tests. Certain medical conditions and the use of certain substances can cause symptoms similar to those associated with GAD. Your caregiver may refer you to a mental health specialist for further evaluation. TREATMENT The following therapies are usually used to treat GAD:   Medication Antidepressant medication usually is  prescribed for long-term daily control. Antianxiety medications may be added in severe cases, especially when panic attacks occur.   Talk therapy (psychotherapy) Certain types of talk therapy can be helpful in treating GAD by providing support, education, and guidance. A form of talk therapy called cognitive behavioral therapy can teach you healthy ways to think about and react to daily life events and activities.  Stress managementtechniques These include yoga, meditation, and exercise and can be very helpful when they are practiced regularly. A mental health specialist can help determine which treatment is best for you. Some people see improvement with one therapy. However, other people require a combination of therapies. Document Released: 01/29/2013 Document Reviewed: 01/29/2013 [facility] Patient Information 2014 [place], [place]. DASH Diet The DASH diet stands for "Dietary Approaches to Stop Hypertension." It is a healthy eating plan that has been shown to reduce high blood pressure (hypertension) in as little as 14 days, while also possibly providing other significant health benefits. These other health benefits include reducing the risk of breast cancer after menopause and reducing the risk of type 2 diabetes, heart disease, colon cancer, and stroke. Health benefits also include weight loss and slowing kidney failure in patients with chronic kidney disease.  DIET GUIDELINES  Limit salt (sodium). Your diet should contain less than 1500 mg of sodium daily.  Limit refined or processed carbohydrates. Your diet should include mostly whole grains. Desserts and added sugars should be used sparingly.  Include small amounts of heart-healthy fats. These types of fats include nuts, oils, and tub margarine. Limit saturated and trans fats. These fats have been shown to be harmful in the body. CHOOSING FOODS  The following food groups are based on a 2000 calorie diet. See  your Registered Dietitian for  individual calorie needs. Grains and Grain Products (6 to 8 servings daily)  Eat More Often: Whole-wheat bread, brown rice, whole-grain or wheat pasta, quinoa, popcorn without added fat or salt (air popped).  Eat Less Often: White bread, white pasta, white rice, cornbread. Vegetables (4 to 5 servings daily)  Eat More Often: Fresh, frozen, and canned vegetables. Vegetables may be raw, steamed, roasted, or grilled with a minimal amount of fat.  Eat Less Often/Avoid: Creamed or fried vegetables. Vegetables in a cheese sauce. Fruit (4 to 5 servings daily)  Eat More Often: All fresh, canned (in natural juice), or frozen fruits. Dried fruits without added sugar. One hundred percent fruit juice ( cup [237 mL] daily).  Eat Less Often: Dried fruits with added sugar. Canned fruit in light or heavy syrup. [employer], Fish, and Poultry (2 servings or less daily. One serving is 3 to 4 oz [85-114 g]).  Eat More Often: Ninety percent or leaner ground beef, tenderloin, sirloin. Round cuts of beef, chicken breast, [place] breast. All fish. Grill, bake, or broil your meat. Nothing should be fried.  Eat Less Often/Avoid: Fatty cuts of meat, [place], or chicken leg, thigh, or wing. Fried cuts of meat or fish. Dairy (2 to 3 servings)  Eat More Often: Low-fat or fat-free milk, low-fat plain or light yogurt, reduced-fat or part-skim cheese.  Eat Less Often/Avoid: Milk (whole, 2%).Whole milk yogurt. Full-fat cheeses. Nuts, Seeds, and Legumes (4 to 5 servings per week)  Eat More Often: All without added salt.  Eat Less Often/Avoid: Salted nuts and seeds, canned beans with added salt. Fats and Sweets (limited)  Eat More Often: Vegetable oils, tub margarines without trans fats, sugar-free gelatin. Mayonnaise and salad dressings.  Eat Less Often/Avoid: Coconut oils, palm oils, butter, stick margarine, cream, half and half, cookies, candy, pie. FOR MORE INFORMATION The Dash Diet Eating Plan:  www.dashdiet.org Document Released: 09/23/2011 Document Revised: 12/27/2011 Document Reviewed: 09/23/2011 [facility] Patient Information 2014 [place], [place]. Hypertension As your heart beats, it forces blood through your arteries. This force is your blood pressure. If the pressure is too high, it is called hypertension (HTN) or high blood pressure. HTN is dangerous because you may have it and not know it. High blood pressure may mean that your heart has to work harder to pump blood. Your arteries may be narrow or stiff. The extra work puts you at risk for heart disease, stroke, and other problems.  Blood pressure consists of two numbers, a higher number over a lower, 110/72, for example. It is stated as "110 over 72." The ideal is below 120 for the top number (systolic) and under 80 for the bottom (diastolic). Write down your blood pressure today. You should pay close attention to your blood pressure if you have certain conditions such as:  Heart failure.  Prior heart attack.  Diabetes  Chronic kidney disease.  Prior stroke.  Multiple risk factors for heart disease. To see if you have HTN, your blood pressure should be measured while you are seated with your arm held at the level of the heart. It should be measured at least twice. A one-time elevated blood pressure reading (especially in the Emergency Department) does not mean that you need treatment. There may be conditions in which the blood pressure is different between your right and left arms. It is important to see your caregiver soon for a recheck. Most people have essential hypertension which means that there is not a specific cause. This  type of high blood pressure may be lowered by changing lifestyle factors such as:  Stress.  Smoking.  Lack of exercise.  Excessive weight.  Drug/tobacco/alcohol use.  Eating less salt. Most people do not have symptoms from high blood pressure until it has caused damage to the body. Effective  treatment can often prevent, delay or reduce that damage. TREATMENT  When a cause has been identified, treatment for high blood pressure is directed at the cause. There are a large number of medications to treat HTN. These fall into several categories, and your caregiver will help you select the medicines that are best for you. Medications may have side effects. You should review side effects with your caregiver. If your blood pressure stays high after you have made lifestyle changes or started on medicines,   Your medication(s) may need to be changed.  Other problems may need to be addressed.  Be certain you understand your prescriptions, and know how and when to take your medicine.  Be sure to follow up with your caregiver within the time frame advised (usually within two weeks) to have your blood pressure rechecked and to review your medications.  If you are taking more than one medicine to lower your blood pressure, make sure you know how and at what times they should be taken. Taking two medicines at the same time can result in blood pressure that is too low. SEEK IMMEDIATE MEDICAL CARE IF:  You develop a severe headache, blurred or changing vision, or confusion.  You have unusual weakness or numbness, or a faint feeling.  You have severe chest or abdominal pain, vomiting, or breathing problems. MAKE SURE YOU:   Understand these instructions.  Will watch your condition.  Will get help right away if you are not doing well or get worse. Document Released: 10/04/2005 Document Revised: 12/27/2011 Document Reviewed: 05/24/2008 [facility] Patient Information 2014 [place], [place].

## 2013-09-17 NOTE — Progress Notes (Signed)
Pt is here today to establish care. Pt reports having anxiety. Pt is having pain in his right foot where he had surgery on his right foot. He believes that the pins are trying to push out of his foot.
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
## 2013-09-18 LAB — URINALYSIS, COMPLETE
Bacteria, UA: NONE SEEN
Bilirubin Urine: NEGATIVE
Casts: NONE SEEN
Glucose, UA: NEGATIVE mg/dL
Hgb urine dipstick: NEGATIVE
Ketones, ur: NEGATIVE mg/dL
Protein, ur: NEGATIVE mg/dL
pH: 6.5 (ref 5.0–8.0)

## 2013-09-24 ENCOUNTER — Ambulatory Visit (HOSPITAL_COMMUNITY)
Admission: RE | Admit: 2013-09-24 | Discharge: 2013-09-24 | Disposition: A | Discharge status: Home / Self Care | Payer: No Typology Code available for payment source | Source: Ambulatory Visit | Attending: Internal Medicine | Admitting: Internal Medicine

## 2013-09-24 DIAGNOSIS — J438 Other emphysema: Secondary | ICD-10-CM | POA: Insufficient documentation

## 2013-09-24 DIAGNOSIS — R911 Solitary pulmonary nodule: Secondary | ICD-10-CM | POA: Insufficient documentation

## 2013-09-24 DIAGNOSIS — R222 Localized swelling, mass and lump, trunk: Secondary | ICD-10-CM | POA: Insufficient documentation

## 2013-09-24 DIAGNOSIS — R918 Other nonspecific abnormal finding of lung field: Secondary | ICD-10-CM

## 2013-09-24 MED ORDER — IOHEXOL 300 MG/ML  SOLN
80.0000 mL | Freq: Once | INTRAMUSCULAR | Status: AC | PRN
  Administered 2013-09-24: 80 mL via INTRAVENOUS

## 2013-09-28 ENCOUNTER — Telehealth: Payer: Self-pay | Admitting: *Deleted

## 2013-09-28 NOTE — Telephone Encounter (Signed)
Message copied by Raynelle Chary on Fri Sep 28, 2013  5:08 PM ------      Message from: Jeanann Lewandowsky E      Created: Thu Sep 27, 2013  3:34 PM       Please call to inform patient that his chest CT shows resolution of the previously noticed abnormality, there is a small nodule has been there for about 3-1/2 years, unchanged which means its benign. No further testing is recommended for now. We advice smoking cessation. ------

## 2013-09-28 NOTE — Telephone Encounter (Signed)
Message copied by Raynelle Chary on Fri Sep 28, 2013  2:11 PM ------      Message from: Jeanann Lewandowsky E      Created: Thu Sep 27, 2013  3:34 PM       Please call to inform patient that his chest CT shows resolution of the previously noticed abnormality, there is a small nodule has been there for about 3-1/2 years, unchanged which means its benign. No further testing is recommended for now. We advice smoking cessation. ------

## 2013-10-01 ENCOUNTER — Telehealth: Payer: Self-pay | Admitting: *Deleted

## 2013-10-01 NOTE — Telephone Encounter (Signed)
Pt called and I reviewed  his lab results with him.

## 2013-11-06 ENCOUNTER — Emergency Department (HOSPITAL_COMMUNITY)
Admission: EM | Admit: 2013-11-06 | Discharge: 2013-11-07 | Disposition: A | Discharge status: Home / Self Care | Payer: No Typology Code available for payment source | Attending: Emergency Medicine | Admitting: Emergency Medicine

## 2013-11-06 ENCOUNTER — Encounter (HOSPITAL_COMMUNITY): Payer: Self-pay | Admitting: Emergency Medicine

## 2013-11-06 DIAGNOSIS — J4489 Other specified chronic obstructive pulmonary disease: Secondary | ICD-10-CM | POA: Insufficient documentation

## 2013-11-06 DIAGNOSIS — Z8719 Personal history of other diseases of the digestive system: Secondary | ICD-10-CM | POA: Insufficient documentation

## 2013-11-06 DIAGNOSIS — G479 Sleep disorder, unspecified: Secondary | ICD-10-CM | POA: Insufficient documentation

## 2013-11-06 DIAGNOSIS — F329 Major depressive disorder, single episode, unspecified: Secondary | ICD-10-CM | POA: Insufficient documentation

## 2013-11-06 DIAGNOSIS — Z862 Personal history of diseases of the blood and blood-forming organs and certain disorders involving the immune mechanism: Secondary | ICD-10-CM | POA: Insufficient documentation

## 2013-11-06 DIAGNOSIS — I10 Essential (primary) hypertension: Secondary | ICD-10-CM | POA: Insufficient documentation

## 2013-11-06 DIAGNOSIS — Z880 Allergy status to penicillin: Secondary | ICD-10-CM | POA: Insufficient documentation

## 2013-11-06 DIAGNOSIS — F3289 Other specified depressive episodes: Secondary | ICD-10-CM | POA: Insufficient documentation

## 2013-11-06 DIAGNOSIS — F17200 Nicotine dependence, unspecified, uncomplicated: Secondary | ICD-10-CM | POA: Insufficient documentation

## 2013-11-06 DIAGNOSIS — Z79899 Other long term (current) drug therapy: Secondary | ICD-10-CM | POA: Insufficient documentation

## 2013-11-06 DIAGNOSIS — J449 Chronic obstructive pulmonary disease, unspecified: Secondary | ICD-10-CM | POA: Insufficient documentation

## 2013-11-06 DIAGNOSIS — Z5900 Homelessness unspecified: Secondary | ICD-10-CM | POA: Insufficient documentation

## 2013-11-06 DIAGNOSIS — R45851 Suicidal ideations: Secondary | ICD-10-CM

## 2013-11-06 LAB — COMPREHENSIVE METABOLIC PANEL
ALT: 14 U/L (ref 0–53)
AST: 25 U/L (ref 0–37)
Albumin: 4.2 g/dL (ref 3.5–5.2)
Alkaline Phosphatase: 72 U/L (ref 39–117)
BILIRUBIN TOTAL: 0.2 mg/dL — AB (ref 0.3–1.2)
BUN: 8 mg/dL (ref 6–23)
CALCIUM: 9.3 mg/dL (ref 8.4–10.5)
CO2: 20 meq/L (ref 19–32)
CREATININE: 0.63 mg/dL (ref 0.50–1.35)
Chloride: 96 mEq/L (ref 96–112)
GFR calc Af Amer: >90 mL/min (ref >90)
GLUCOSE: 75 mg/dL (ref 70–99)
Potassium: 4.6 mEq/L (ref 3.7–5.3)
Sodium: 136 mEq/L — ABNORMAL LOW (ref 137–147)
Total Protein: 9.2 g/dL — ABNORMAL HIGH (ref 6.0–8.3)

## 2013-11-06 LAB — CBC
HEMATOCRIT: 40.9 % (ref 39.0–52.0)
HEMOGLOBIN: 14.3 g/dL (ref 13.0–17.0)
MCH: 29.1 pg (ref 26.0–34.0)
MCHC: 35 g/dL (ref 30.0–36.0)
MCV: 83.3 fL (ref 78.0–100.0)
Platelets: 414 10*3/uL — ABNORMAL HIGH (ref 150–400)
RBC: 4.91 MIL/uL (ref 4.22–5.81)
RDW: 16.5 % — ABNORMAL HIGH (ref 11.5–15.5)
WBC: 9 10*3/uL (ref 4.0–10.5)

## 2013-11-06 LAB — SALICYLATE LEVEL

## 2013-11-06 LAB — RAPID URINE DRUG SCREEN, HOSP PERFORMED
AMPHETAMINES: NOT DETECTED
Barbiturates: NOT DETECTED
Benzodiazepines: NOT DETECTED
Cocaine: NOT DETECTED
Opiates: NOT DETECTED
TETRAHYDROCANNABINOL: NOT DETECTED

## 2013-11-06 LAB — ETHANOL: Alcohol, Ethyl (B): 207 mg/dL — ABNORMAL HIGH (ref 0–11)

## 2013-11-06 LAB — ACETAMINOPHEN LEVEL: Acetaminophen (Tylenol), Serum: <15 ug/mL (ref 10–30)

## 2013-11-06 MED ORDER — ONDANSETRON HCL 4 MG PO TABS: 4.0000 mg | ORAL_TABLET | Freq: Three times a day (TID) | ORAL | Status: DC | PRN

## 2013-11-06 MED ORDER — ALUM & MAG HYDROXIDE-SIMETH 200-200-20 MG/5ML PO SUSP: 30.0000 mL | ORAL | Status: DC | PRN

## 2013-11-06 MED ORDER — NICOTINE 21 MG/24HR TD PT24
21.0000 mg | MEDICATED_PATCH | Freq: Every day | TRANSDERMAL | Status: DC
Start: 2013-11-06 — End: 2013-11-07

## 2013-11-06 MED ORDER — ACETAMINOPHEN 325 MG PO TABS: 650.0000 mg | ORAL_TABLET | ORAL | Status: DC | PRN

## 2013-11-06 MED ORDER — ZOLPIDEM TARTRATE 5 MG PO TABS: 5.0000 mg | ORAL_TABLET | Freq: Every evening | ORAL | Status: DC | PRN
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
## 2013-11-06 NOTE — ED Notes (Signed)
Call Daughter for updates. [phone]
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
## 2013-11-06 NOTE — ED Notes (Signed)
Pt reports depression and nervousness over last 4 days.  Pt reports drinking over the last 4 days.  Pt has suicidal thoughts with plans to kill self with knife.

## 2013-11-06 NOTE — ED Notes (Signed)
PA at bedside.

## 2013-11-06 NOTE — ED Notes (Signed)
Pt reports suicidal thoughts x 2 days with plan to kill himself with a knife. Pt accompanied by social work and [employer] friend. States "I normally have difficulty this time of year with depression. My whole family has died and a lot of them have birthdays in December." Pt denies any drug abuse. States he has been clean "for a long time" but reports occasional alcohol use. States "I drink a couple of shots per day. I use to really abuse alcohol but I have improved." Pt states hx of being homeless, but has lived in his own apt for approximately 4 months. Pt calm and cooperative. States he is feeling restless. Reports insomnia with restlessness for several nights.

## 2013-11-07 DIAGNOSIS — R45851 Suicidal ideations: Secondary | ICD-10-CM

## 2013-11-07 MED ORDER — LORAZEPAM 1 MG PO TABS: 1.0000 mg | ORAL_TABLET | Freq: Once | ORAL | Status: DC

## 2013-11-07 NOTE — ED Provider Notes (Signed)
Patient resting comfortably this morning with no overnight problems. Continue psychiatric evaluation for possible placement.  Filed Vitals:   11/07/13 0610  BP: 136/95  Pulse: 95  Temp: 97.3 F (36.3 C)  Resp: 18     Gilda Creasehristopher J. Pollina, MD 11/07/13 1006
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
## 2013-11-07 NOTE — ED Notes (Signed)
Dr Anner CreteWells (ACTT physician) will pick up the pt at 5 pm as pt is for discharge.

## 2013-11-07 NOTE — ED Notes (Signed)
Victorino DikeJennifer outpt ACT: [phone] please update
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
## 2013-11-07 NOTE — ED Notes (Signed)
Dr. Anner CreteWells -- phone number -- [phone] Victorino DikeJennifer -- ACTT case manager --- [phone]  Please notify/call for ride home at discharge

## 2013-11-07 NOTE — BH Assessment (Signed)
Telepsych for this patient is scheduled for 11am with Conrad, NP. Writer has contacted patient's nurse to provide updates for time of telepsyc. Writer told by staff that this patient's nurse is busy at this time and to call back at a later time. Writer will follow up and call back at a later time.   

## 2013-11-07 NOTE — Discharge Instructions (Signed)
Depression, Adult °Depression is feeling sad, low, down in the dumps, blue, gloomy, or empty. In general, there are two kinds of depression: °· Normal sadness or grief. This can happen after something upsetting. It often goes away on its own within 2 weeks. After losing a loved one (bereavement), normal sadness and grief may last longer than two weeks. It usually gets better with time. °· Clinical depression. This kind lasts longer than normal sadness or grief. It keeps you from doing the things you normally do in life. It is often hard to function at home, work, or at school. It may affect your relationships with others. Treatment is often needed. °GET HELP RIGHT AWAY IF: °· You have thoughts about hurting yourself or others. °· You lose touch with reality (psychotic symptoms). You may: °· See or hear things that are not real. °· Have untrue beliefs about your life or people around you. °· Your medicine is giving you problems. °MAKE SURE YOU: °· Understand these instructions. °· Will watch your condition. °· Will get help right away if you are not doing well or get worse. °Document Released: 11/06/2010 Document Revised: 06/28/2012 Document Reviewed: 02/03/2012 °ExitCare® Patient Information ©2014 [employer]. ° °

## 2013-11-07 NOTE — ED Notes (Signed)
Dr. Mack GuiseJames Bradley (ACTT physician) in to see pt-- suggestion for medication is---Vistaril 50mg , Neurontin 300mg , Ablify-give all three today.

## 2013-11-07 NOTE — ED Notes (Signed)
Pt discharged.Vital sign stable and GCS 15.[facility]

## 2013-11-07 NOTE — ED Provider Notes (Signed)
Patient not seen by me and I was not directly involved in the patient care. Behavioral Health/Psychiatry has evaluated the patient and, as per their note and recommendation, patient was to be discharged. Follow-up recommendations and medications determined by/prescribed by Behavioral Health/Psychiatry.  Patient will be discharged to the care of his community ACT team. They have arranged for outpatient care. Patient has contracted for safety.   Gilda Creasehristopher J. Lianni Kanaan, MD 11/07/13 [phone]

## 2013-11-08 NOTE — ED Provider Notes (Signed)
Medical screening examination/treatment/procedure(s) were performed by non-physician practitioner and as supervising physician I was immediately available for consultation/collaboration.  EKG Interpretation   None         Loren Raceravid Lesieli Bresee, MD 11/08/13 [id number]

## 2013-11-13 ENCOUNTER — Telehealth: Payer: Self-pay | Admitting: *Deleted

## 2013-11-13 NOTE — Telephone Encounter (Signed)
Message copied by Raynelle CharyWINFREE, Rainey Kahrs R on Tue Nov 13, 2013  2:30 PM ------      Message from: Jeanann LewandowskyJEGEDE, OLUGBEMIGA E      Created: Thu Sep 27, 2013  3:34 PM       Please call to inform patient that his chest CT shows resolution of the previously noticed abnormality, there is a small nodule has been there for about 3-1/2 years, unchanged which means its benign. No further testing is recommended for now. We advice smoking cessation. ------

## 2013-12-18 ENCOUNTER — Ambulatory Visit: Payer: No Typology Code available for payment source | Admitting: Internal Medicine

## 2013-12-26 ENCOUNTER — Ambulatory Visit: Payer: No Typology Code available for payment source | Attending: Internal Medicine

## 2014-01-15 ENCOUNTER — Encounter: Payer: Self-pay | Admitting: Internal Medicine

## 2014-01-15 ENCOUNTER — Ambulatory Visit: Payer: No Typology Code available for payment source | Attending: Internal Medicine | Admitting: Internal Medicine

## 2014-01-15 VITALS — BP 112/79 | HR 81 | Temp 97.8°F | Resp 16 | Ht 67.0 in | Wt 138.0 lb

## 2014-01-15 DIAGNOSIS — F17200 Nicotine dependence, unspecified, uncomplicated: Secondary | ICD-10-CM | POA: Insufficient documentation

## 2014-01-15 DIAGNOSIS — Z5900 Homelessness unspecified: Secondary | ICD-10-CM

## 2014-01-15 DIAGNOSIS — F329 Major depressive disorder, single episode, unspecified: Secondary | ICD-10-CM | POA: Insufficient documentation

## 2014-01-15 DIAGNOSIS — Z720 Tobacco use: Secondary | ICD-10-CM

## 2014-01-15 DIAGNOSIS — J449 Chronic obstructive pulmonary disease, unspecified: Secondary | ICD-10-CM

## 2014-01-15 DIAGNOSIS — F411 Generalized anxiety disorder: Secondary | ICD-10-CM | POA: Insufficient documentation

## 2014-01-15 DIAGNOSIS — J4489 Other specified chronic obstructive pulmonary disease: Secondary | ICD-10-CM | POA: Insufficient documentation

## 2014-01-15 DIAGNOSIS — F3289 Other specified depressive episodes: Secondary | ICD-10-CM | POA: Insufficient documentation

## 2014-01-15 DIAGNOSIS — I10 Essential (primary) hypertension: Secondary | ICD-10-CM | POA: Insufficient documentation

## 2014-01-15 NOTE — Patient Instructions (Signed)
DASH Diet  The DASH diet stands for "Dietary Approaches to Stop Hypertension." It is a healthy eating plan that has been shown to reduce high blood pressure (hypertension) in as little as 14 days, while also possibly providing other significant health benefits. These other health benefits include reducing the risk of breast cancer after menopause and reducing the risk of type 2 diabetes, heart disease, colon cancer, and stroke. Health benefits also include weight loss and slowing kidney failure in patients with chronic kidney disease.   DIET GUIDELINES  · Limit salt (sodium). Your diet should contain less than 1500 mg of sodium daily.  · Limit refined or processed carbohydrates. Your diet should include mostly whole grains. Desserts and added sugars should be used sparingly.  · Include small amounts of heart-healthy fats. These types of fats include nuts, oils, and tub margarine. Limit saturated and trans fats. These fats have been shown to be harmful in the body.  CHOOSING FOODS   The following food groups are based on a 2000 calorie diet. See your Registered Dietitian for individual calorie needs.  Grains and Grain Products (6 to 8 servings daily)  · Eat More Often: Whole-wheat bread, brown rice, whole-grain or wheat pasta, quinoa, popcorn without added fat or salt (air popped).  · Eat Less Often: White bread, white pasta, white rice, cornbread.  Vegetables (4 to 5 servings daily)  · Eat More Often: Fresh, frozen, and canned vegetables. Vegetables may be raw, steamed, roasted, or grilled with a minimal amount of fat.  · Eat Less Often/Avoid: Creamed or fried vegetables. Vegetables in a cheese sauce.  Fruit (4 to 5 servings daily)  · Eat More Often: All fresh, canned (in natural juice), or frozen fruits. Dried fruits without added sugar. One hundred percent fruit juice (½ cup [237 mL] daily).  · Eat Less Often: Dried fruits with added sugar. Canned fruit in light or heavy syrup.  Lean Meats, Fish, and Poultry (2  servings or less daily. One serving is 3 to 4 oz [85-114 g]).  · Eat More Often: Ninety percent or leaner ground beef, tenderloin, sirloin. Round cuts of beef, chicken breast, turkey breast. All fish. Grill, bake, or broil your meat. Nothing should be fried.  · Eat Less Often/Avoid: Fatty cuts of meat, turkey, or chicken leg, thigh, or wing. Fried cuts of meat or fish.  Dairy (2 to 3 servings)  · Eat More Often: Low-fat or fat-free milk, low-fat plain or light yogurt, reduced-fat or part-skim cheese.  · Eat Less Often/Avoid: Milk (whole, 2%). Whole milk yogurt. Full-fat cheeses.  Nuts, Seeds, and Legumes (4 to 5 servings per week)  · Eat More Often: All without added salt.  · Eat Less Often/Avoid: Salted nuts and seeds, canned beans with added salt.  Fats and Sweets (limited)  · Eat More Often: Vegetable oils, tub margarines without trans fats, sugar-free gelatin. Mayonnaise and salad dressings.  · Eat Less Often/Avoid: Coconut oils, palm oils, butter, stick margarine, cream, half and half, cookies, candy, pie.  FOR MORE INFORMATION  The Dash Diet Eating Plan: www.dashdiet.org  Document Released: 09/23/2011 Document Revised: 12/27/2011 Document Reviewed: 09/23/2011  ExitCare® Patient Information ©2014 [employer].
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
## 2014-01-15 NOTE — Progress Notes (Signed)
Pt is here today following up on his HTN.

## 2014-02-05 ENCOUNTER — Other Ambulatory Visit: Payer: Self-pay | Admitting: *Deleted

## 2014-02-05 DIAGNOSIS — I10 Essential (primary) hypertension: Secondary | ICD-10-CM

## 2014-02-05 MED ORDER — LISINOPRIL 20 MG PO TABS: 20.0000 mg | ORAL_TABLET | Freq: Every morning | ORAL | Status: DC

## 2014-04-09 ENCOUNTER — Other Ambulatory Visit: Payer: Self-pay | Admitting: *Deleted

## 2014-04-09 DIAGNOSIS — I10 Essential (primary) hypertension: Secondary | ICD-10-CM

## 2014-04-09 MED ORDER — LISINOPRIL 20 MG PO TABS: 20.0000 mg | ORAL_TABLET | Freq: Every morning | ORAL | Status: DC

## 2014-05-02 ENCOUNTER — Ambulatory Visit: Payer: No Typology Code available for payment source | Attending: Internal Medicine | Admitting: Internal Medicine

## 2014-05-02 ENCOUNTER — Encounter: Payer: Self-pay | Admitting: Internal Medicine

## 2014-05-02 VITALS — BP 127/93 | HR 72 | Temp 98.3°F | Resp 16 | Ht 68.0 in | Wt 154.0 lb

## 2014-05-02 DIAGNOSIS — I10 Essential (primary) hypertension: Secondary | ICD-10-CM | POA: Insufficient documentation

## 2014-05-02 DIAGNOSIS — F17200 Nicotine dependence, unspecified, uncomplicated: Secondary | ICD-10-CM | POA: Insufficient documentation

## 2014-05-02 DIAGNOSIS — J449 Chronic obstructive pulmonary disease, unspecified: Secondary | ICD-10-CM | POA: Insufficient documentation

## 2014-05-02 DIAGNOSIS — IMO0001 Reserved for inherently not codable concepts without codable children: Secondary | ICD-10-CM

## 2014-05-02 DIAGNOSIS — K029 Dental caries, unspecified: Secondary | ICD-10-CM

## 2014-05-02 DIAGNOSIS — J4489 Other specified chronic obstructive pulmonary disease: Secondary | ICD-10-CM | POA: Insufficient documentation

## 2014-05-02 MED ORDER — HYDROXYZINE PAMOATE 100 MG PO CAPS: 100.0000 mg | ORAL_CAPSULE | Freq: Four times a day (QID) | ORAL | Status: DC | PRN

## 2014-05-02 NOTE — Patient Instructions (Signed)
Melatonin oral capsules and tablets What is this medicine? MELATONIN (mel uh TOH nin) is a dietary supplement. It is promoted to help maintain normal sleep patterns. The FDA has not approved this supplement for any medical use. This supplement may be used for other purposes; ask your health care provider or pharmacist if you have questions. This medicine may be used for other purposes; ask your health care provider or pharmacist if you have questions. COMMON BRAND NAME(S): Melatonex What should I tell my health care provider before I take this medicine? They need to know if you have any of these conditions: -cancer -if you frequently drink alcohol containing drinks -immune system problems -liver disease -seizure disorder -an unusual or allergic reaction to melatonin, other medicines, foods, dyes, or preservatives -pregnant or trying to get pregnant -breast-feeding How should I use this medicine? Take this supplement by mouth with a glass of water. This supplement is usually taken prior to bedtime. Do not chew or crush most tablets or capsules. Some tablets are chewable and are chewed before swallowing. Some tablets are meant to be dissolved in the mouth or under the tongue. Follow the directions on the package labeling, or take as directed by your health care professional. Do not take this supplement more often than directed. Talk to your pediatrician regarding the use of this supplement in children. This supplement is not recommended for use in children. Overdosage: If you think you have taken too much of this medicine contact a poison control center or emergency room at once. NOTE: This medicine is only for you. Do not share this medicine with others. What if I miss a dose? This does not apply; this medicine is not for regular use. Do not take double or extra doses. What may interact with this medicine? Check with your doctor or healthcare professional if you are taking any of the following  medications: -hormone medicines -medicines for blood pressure like nifedipine -medications for anxiety, depression, or other emotional or psychiatric problems -medications for seizures -medications for sleep -other herbal or dietary supplements -tamoxifen -treatments for cancer or immune disorders This list may not describe all possible interactions. Give your health care provider a list of all the medicines, herbs, non-prescription drugs, or dietary supplements you use. Also tell them if you smoke, drink alcohol, or use illegal drugs. Some items may interact with your medicine. What should I watch for while using this medicine? See your doctor if your symptoms do not get better or if they get worse. Do not take this supplement for more than 2 weeks unless your doctor tells you to. You may get drowsy or dizzy. Do not drive, use machinery, or do anything that needs mental alertness until you know how this medicine affects you. Do not stand or sit up quickly, especially if you are an older patient. This reduces the risk of dizzy or fainting spells. Alcohol may interfere with the effect of this medicine. Avoid alcoholic drinks. Talk to your doctor before you use this supplement if you are currently being treated for an emotional, mental, or sleep problem. This medicine may interfere with your treatment. Herbal or dietary supplements are not regulated like medicines. Rigid quality control standards are not required for dietary supplements. The purity and strength of these products can vary. The safety and effect of this dietary supplement for a certain disease or illness is not well known. This product is not intended to diagnose, treat, cure or prevent any disease. The Food and Drug Administration   suggests the following to help consumers protect themselves: -Always read product labels and follow directions. -Natural does not mean a product is safe for humans to take. -Look for products that include  USP after the ingredient name. This means that the manufacturer followed the standards of the [place] Pharmacopoeia. -Supplements made or sold by a nationally known food or drug company are more likely to be made under tight controls. You can write to the company for more information about how the product was made. What side effects may I notice from receiving this medicine? Side effects that you should report to your doctor or health care professional as soon as possible: -allergic reactions like skin rash, itching or hives, swelling of the face, lips, or tongue -breathing problems -confusion, forgetful -depressed, nervous, or other mood changes -fast or pounding heartbeat -trouble staying awake or alert during the day Side effects that usually do not require medical attention (report to your doctor or health care professional if they continue or are bothersome): -drowsiness, dizziness -headache -nightmares -upset stomach This list may not describe all possible side effects. Call your doctor for medical advice about side effects. You may report side effects to FDA at 1-800-FDA-1088. Where should I keep my medicine? Keep out of the reach of children. Store at room temperature or as directed on the package label. Protect from moisture. Throw away any unused supplement after the expiration date. NOTE: This sheet is a summary. It may not cover all possible information. If you have questions about this medicine, talk to your doctor, pharmacist, or health care provider.  2015, Elsevier/Gold Standard. (2013-08-17 18:36:27)  

## 2014-05-02 NOTE — Progress Notes (Signed)
Pt here to f/u for dental referral C/o upper/lower tooth pain with holes and broken teeth Denies abscess or drainage Pt also requesting pain medication change from Tramadol. Narcotic policy given
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
## 2014-08-14 ENCOUNTER — Ambulatory Visit: Payer: Self-pay | Attending: Internal Medicine

## 2014-09-17 ENCOUNTER — Other Ambulatory Visit: Payer: Self-pay

## 2014-09-17 ENCOUNTER — Ambulatory Visit (HOSPITAL_COMMUNITY)
Admission: RE | Admit: 2014-09-17 | Discharge: 2014-09-17 | Disposition: A | Discharge status: Home / Self Care | Payer: No Typology Code available for payment source | Source: Ambulatory Visit | Attending: Cardiology | Admitting: Cardiology

## 2014-09-17 ENCOUNTER — Encounter: Payer: Self-pay | Admitting: Internal Medicine

## 2014-09-17 ENCOUNTER — Ambulatory Visit: Payer: No Typology Code available for payment source | Attending: Internal Medicine | Admitting: Internal Medicine

## 2014-09-17 VITALS — BP 132/93 | HR 104 | Temp 97.4°F | Resp 14 | Ht 67.0 in | Wt 155.0 lb

## 2014-09-17 DIAGNOSIS — I10 Essential (primary) hypertension: Secondary | ICD-10-CM | POA: Insufficient documentation

## 2014-09-17 DIAGNOSIS — Z0000 Encounter for general adult medical examination without abnormal findings: Secondary | ICD-10-CM

## 2014-09-17 DIAGNOSIS — K219 Gastro-esophageal reflux disease without esophagitis: Secondary | ICD-10-CM | POA: Insufficient documentation

## 2014-09-17 DIAGNOSIS — R079 Chest pain, unspecified: Secondary | ICD-10-CM | POA: Insufficient documentation

## 2014-09-17 DIAGNOSIS — F411 Generalized anxiety disorder: Secondary | ICD-10-CM | POA: Insufficient documentation

## 2014-09-17 LAB — POCT GLYCOSYLATED HEMOGLOBIN (HGB A1C): Hemoglobin A1C: 5.7

## 2014-09-17 MED ORDER — PANTOPRAZOLE SODIUM 40 MG PO TBEC
40.0000 mg | DELAYED_RELEASE_TABLET | Freq: Every day | ORAL | Status: DC
Start: 2014-09-17 — End: 2016-04-24
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
## 2014-09-17 NOTE — Patient Instructions (Signed)
Generalized Anxiety Disorder Generalized anxiety disorder (GAD) is a mental disorder. It interferes with life functions, including relationships, work, and school. GAD is different from normal anxiety, which everyone experiences at some point in their lives in response to specific life events and activities. Normal anxiety actually helps us prepare for and get through these life events and activities. Normal anxiety goes away after the event or activity is over.  GAD causes anxiety that is not necessarily related to specific events or activities. It also causes excess anxiety in proportion to specific events or activities. The anxiety associated with GAD is also difficult to control. GAD can vary from mild to severe. People with severe GAD can have intense waves of anxiety with physical symptoms (panic attacks).  SYMPTOMS The anxiety and worry associated with GAD are difficult to control. This anxiety and worry are related to many life events and activities and also occur more days than not for 6 months or longer. People with GAD also have three or more of the following symptoms (one or more in children):  Restlessness.   Fatigue.  Difficulty concentrating.   Irritability.  Muscle tension.  Difficulty sleeping or unsatisfying sleep. DIAGNOSIS GAD is diagnosed through an assessment by your health care provider. Your health care provider will ask you questions aboutyour mood,physical symptoms, and events in your life. Your health care provider may ask you about your medical history and use of alcohol or drugs, including prescription medicines. Your health care provider may also do a physical exam and blood tests. Certain medical conditions and the use of certain substances can cause symptoms similar to those associated with GAD. Your health care provider may refer you to a mental health specialist for further evaluation. TREATMENT The following therapies are usually used to treat GAD:    Medication. Antidepressant medication usually is prescribed for long-term daily control. Antianxiety medicines may be added in severe cases, especially when panic attacks occur.   Talk therapy (psychotherapy). Certain types of talk therapy can be helpful in treating GAD by providing support, education, and guidance. A form of talk therapy called cognitive behavioral therapy can teach you healthy ways to think about and react to daily life events and activities.  Stress managementtechniques. These include yoga, meditation, and exercise and can be very helpful when they are practiced regularly. A mental health specialist can help determine which treatment is best for you. Some people see improvement with one therapy. However, other people require a combination of therapies. Document Released: 01/29/2013 Document Revised: 02/18/2014 Document Reviewed: 01/29/2013 ExitCare Patient Information 2015 [employer]. This information is not intended to replace advice given to you by your health care provider. Make sure you discuss any questions you have with your health care provider.  

## 2014-09-17 NOTE — Progress Notes (Signed)
Pt is here today stating that he has been having sharp chest pain for about a month. Pt is still currently smoking.

## 2014-09-18 LAB — COMPLETE METABOLIC PANEL WITH GFR
ALT: 15 U/L (ref 0–53)
AST: 20 U/L (ref 0–37)
Albumin: 4.1 g/dL (ref 3.5–5.2)
Alkaline Phosphatase: 87 U/L (ref 39–117)
BUN: 5 mg/dL — AB (ref 6–23)
CO2: 24 mEq/L (ref 19–32)
Calcium: 9.2 mg/dL (ref 8.4–10.5)
Chloride: 102 mEq/L (ref 96–112)
Creat: 0.68 mg/dL (ref 0.50–1.35)
GFR, Est African American: >89 mL/min
GFR, Est Non African American: >89 mL/min
Glucose, Bld: 81 mg/dL (ref 70–99)
Potassium: 4.6 mEq/L (ref 3.5–5.3)
SODIUM: 133 meq/L — AB (ref 135–145)
TOTAL PROTEIN: 7.8 g/dL (ref 6.0–8.3)
Total Bilirubin: 0.2 mg/dL (ref 0.2–1.2)

## 2014-09-18 LAB — CBC WITH DIFFERENTIAL/PLATELET
BASOS ABS: 0 10*3/uL (ref 0.0–0.1)
Basophils Relative: 0 % (ref 0–1)
Eosinophils Absolute: 0.3 10*3/uL (ref 0.0–0.7)
Eosinophils Relative: 3 % (ref 0–5)
HCT: 37.7 % — ABNORMAL LOW (ref 39.0–52.0)
Hemoglobin: 12.6 g/dL — ABNORMAL LOW (ref 13.0–17.0)
Lymphocytes Relative: 25 % (ref 12–46)
Lymphs Abs: 2.4 10*3/uL (ref 0.7–4.0)
MCH: 26.9 pg (ref 26.0–34.0)
MCHC: 33.4 g/dL (ref 30.0–36.0)
MCV: 80.6 fL (ref 78.0–100.0)
MONO ABS: 0.8 10*3/uL (ref 0.1–1.0)
MPV: 9.6 fL (ref 9.4–12.4)
Monocytes Relative: 8 % (ref 3–12)
Neutro Abs: 6.1 10*3/uL (ref 1.7–7.7)
Neutrophils Relative %: 64 % (ref 43–77)
PLATELETS: 452 10*3/uL — AB (ref 150–400)
RBC: 4.68 MIL/uL (ref 4.22–5.81)
RDW: 17.5 % — ABNORMAL HIGH (ref 11.5–15.5)
WBC: 9.5 10*3/uL (ref 4.0–10.5)

## 2014-09-18 LAB — URINALYSIS, COMPLETE
Bacteria, UA: NONE SEEN
Bilirubin Urine: NEGATIVE
CASTS: NONE SEEN
CRYSTALS: NONE SEEN
Glucose, UA: NEGATIVE mg/dL
Hgb urine dipstick: NEGATIVE
Ketones, ur: NEGATIVE mg/dL
Leukocytes, UA: NEGATIVE
Nitrite: NEGATIVE
PROTEIN: NEGATIVE mg/dL
Specific Gravity, Urine: <1.005 — ABNORMAL LOW (ref 1.005–1.030)
Squamous Epithelial / LPF: NONE SEEN
Urobilinogen, UA: 0.2 mg/dL (ref 0.0–1.0)
pH: 7 (ref 5.0–8.0)

## 2014-09-18 LAB — TSH: TSH: 3.209 u[IU]/mL (ref 0.350–4.500)

## 2014-09-18 LAB — LIPID PANEL
CHOL/HDL RATIO: 5 ratio
CHOLESTEROL: 200 mg/dL (ref 0–200)
HDL: 40 mg/dL (ref >39)
LDL Cholesterol: 122 mg/dL — ABNORMAL HIGH (ref 0–99)
Triglycerides: 188 mg/dL — ABNORMAL HIGH (ref <150)
VLDL: 38 mg/dL (ref 0–40)

## 2014-09-24 ENCOUNTER — Telehealth: Payer: Self-pay | Admitting: Emergency Medicine

## 2014-09-24 NOTE — Telephone Encounter (Signed)
Left message for pt call for lab results

## 2014-09-24 NOTE — Telephone Encounter (Signed)
-----   Message from Quentin Angstlugbemiga E Jegede, MD sent at 09/18/2014 12:58 PM EST ----- Please inform patient that his laboratory test results are mostly within normal limit except for his cholesterol level. His hemoglobin level was also slightly low. Encourage low-cholesterol and low-fat diet, regular physical exercise. We will await the results of colonoscopy.

## 2014-10-07 ENCOUNTER — Encounter: Payer: Self-pay | Admitting: Internal Medicine

## 2014-11-20 ENCOUNTER — Telehealth: Payer: Self-pay | Admitting: Internal Medicine

## 2014-11-28 ENCOUNTER — Other Ambulatory Visit: Payer: Self-pay

## 2014-11-28 ENCOUNTER — Telehealth: Payer: Self-pay

## 2014-11-28 DIAGNOSIS — Z1211 Encounter for screening for malignant neoplasm of colon: Secondary | ICD-10-CM

## 2014-12-03 NOTE — Telephone Encounter (Signed)
Gastroenterology Pre-Procedure Review  Request Date: 11/28/2014 Requesting Physician: Dr. Jeanann Lewandowskylugbemiga Jegede  PATIENT REVIEW QUESTIONS: The patient responded to the following health history questions as indicated:    Pt said he drinks a couple of beers a day  1. Diabetes Melitis: no 2. Joint replacements in the past 12 months: no 3. Major health problems in the past 3 months: no 4. Has an artificial valve or MVP: no 5. Has a defibrillator: no 6. Has been advised in past to take antibiotics in advance of a procedure like teeth cleaning: no    MEDICATIONS & ALLERGIES:    Patient reports the following regarding taking any blood thinners:   Plavix? no Aspirin? no Coumadin? no  Patient confirms/reports the following medications:  Current Outpatient Prescriptions  Medication Sig Dispense Refill  . amitriptyline (ELAVIL) 50 MG tablet Take 1 tablet (50 mg total) by mouth at bedtime. (Patient taking differently: Take 150 mg by mouth at bedtime. ) 30 tablet 2  . gabapentin (NEURONTIN) 300 MG capsule Take 300 mg by mouth 3 (three) times daily.    . hydrOXYzine (VISTARIL) 100 MG capsule Take 1 capsule (100 mg total) by mouth 4 (four) times daily as needed for anxiety, itching or nausea. takes 50mg  three times daily, then takes 100mg  at bedtime 120 capsule 1  . lisinopril (PRINIVIL,ZESTRIL) 20 MG tablet Take 1 tablet (20 mg total) by mouth every morning. 30 tablet 0  . pantoprazole (PROTONIX) 40 MG tablet Take 1 tablet (40 mg total) by mouth daily. 30 tablet 3  . ARIPiprazole (ABILIFY) 10 MG tablet Take 1 tablet (10 mg total) by mouth daily. (Patient not taking: Reported on 11/28/2014) 30 tablet 2   No current facility-administered medications for this visit.    Patient confirms/reports the following allergies:  Allergies  Allergen Reactions  . Aspirin Hives and Other (See Comments)    GI upset  . Penicillins Swelling and Other (See Comments)    GI upset     No orders of the defined types  were placed in this encounter.    AUTHORIZATION INFORMATION Primary Insurance:  ID #:   Group #:  Pre-Cert / Auth required:  Pre-Cert / Auth #:   Secondary Insurance:   ID #: Group #:  Pre-Cert / Auth required:  Pre-Cert / Auth #:   SCHEDULE INFORMATION: Procedure has been scheduled as follows:  Date:  01/01/2015     Time:  9:30 AM Location[facility]  This Gastroenterology Pre-Precedure Review Form is being routed to the following provider(s): R. Roetta SessionsMichael Rourk, MD

## 2014-12-04 NOTE — Telephone Encounter (Signed)
Patient has couple of medications + etoh (much more than 2 beers daily listed in Social Hx) that would be concerning for possible need in deep sedation.   Suggest OV prior to scheduling colonoscopy.

## 2014-12-06 NOTE — Telephone Encounter (Signed)
LMOM to call to schedule OV. TCS cancelled and Eber JonesCarolyn is aware in Endo.

## 2014-12-10 NOTE — Telephone Encounter (Signed)
Tried to call pt. NO answer. Sending letter for pt to call and schedule and OV, colonoscopy has been cancelled since he will probably need to be done in OR.

## 2015-01-01 ENCOUNTER — Encounter (HOSPITAL_COMMUNITY): Admission: RE | Payer: Self-pay | Source: Ambulatory Visit

## 2015-01-01 ENCOUNTER — Ambulatory Visit (HOSPITAL_COMMUNITY)
Admission: RE | Admit: 2015-01-01 | Payer: No Typology Code available for payment source | Source: Ambulatory Visit | Admitting: Internal Medicine

## 2015-01-01 SURGERY — COLONOSCOPY
Anesthesia: Moderate Sedation

## 2015-01-02 ENCOUNTER — Ambulatory Visit: Payer: Self-pay | Admitting: Gastroenterology

## 2015-01-24 ENCOUNTER — Ambulatory Visit: Payer: Self-pay | Admitting: Gastroenterology

## 2015-02-05 ENCOUNTER — Telehealth: Payer: Self-pay | Admitting: Gastroenterology

## 2015-02-05 ENCOUNTER — Ambulatory Visit: Payer: Self-pay | Admitting: Gastroenterology

## 2015-02-05 ENCOUNTER — Encounter: Payer: Self-pay | Admitting: Gastroenterology

## 2015-02-05 NOTE — Telephone Encounter (Signed)
PATIENT WAS A NO SHOW 02/05/15 AND LETTER WAS SENT  °

## 2015-06-06 ENCOUNTER — Encounter (HOSPITAL_COMMUNITY): Payer: Self-pay | Admitting: Emergency Medicine

## 2015-06-06 ENCOUNTER — Emergency Department (HOSPITAL_COMMUNITY)
Admission: EM | Admit: 2015-06-06 | Discharge: 2015-06-08 | Disposition: A | Discharge status: Home / Self Care | Payer: Medicaid Other | Attending: Emergency Medicine | Admitting: Emergency Medicine

## 2015-06-06 DIAGNOSIS — Z79899 Other long term (current) drug therapy: Secondary | ICD-10-CM | POA: Diagnosis not present

## 2015-06-06 DIAGNOSIS — Z880 Allergy status to penicillin: Secondary | ICD-10-CM | POA: Insufficient documentation

## 2015-06-06 DIAGNOSIS — K219 Gastro-esophageal reflux disease without esophagitis: Secondary | ICD-10-CM | POA: Diagnosis not present

## 2015-06-06 DIAGNOSIS — Z862 Personal history of diseases of the blood and blood-forming organs and certain disorders involving the immune mechanism: Secondary | ICD-10-CM | POA: Insufficient documentation

## 2015-06-06 DIAGNOSIS — Z590 Homelessness: Secondary | ICD-10-CM | POA: Insufficient documentation

## 2015-06-06 DIAGNOSIS — Z720 Tobacco use: Secondary | ICD-10-CM | POA: Diagnosis not present

## 2015-06-06 DIAGNOSIS — F329 Major depressive disorder, single episode, unspecified: Secondary | ICD-10-CM | POA: Insufficient documentation

## 2015-06-06 DIAGNOSIS — Z008 Encounter for other general examination: Secondary | ICD-10-CM | POA: Diagnosis present

## 2015-06-06 DIAGNOSIS — J449 Chronic obstructive pulmonary disease, unspecified: Secondary | ICD-10-CM | POA: Insufficient documentation

## 2015-06-06 DIAGNOSIS — F419 Anxiety disorder, unspecified: Secondary | ICD-10-CM | POA: Diagnosis not present

## 2015-06-06 DIAGNOSIS — F10950 Alcohol use, unspecified with alcohol-induced psychotic disorder with delusions: Secondary | ICD-10-CM | POA: Insufficient documentation

## 2015-06-06 DIAGNOSIS — F32A Depression, unspecified: Secondary | ICD-10-CM

## 2015-06-06 DIAGNOSIS — I10 Essential (primary) hypertension: Secondary | ICD-10-CM | POA: Diagnosis not present

## 2015-06-06 LAB — CBC
HCT: 31.3 % — ABNORMAL LOW (ref 39.0–52.0)
Hemoglobin: 10.4 g/dL — ABNORMAL LOW (ref 13.0–17.0)
MCH: 26.6 pg (ref 26.0–34.0)
MCHC: 33.2 g/dL (ref 30.0–36.0)
MCV: 80.1 fL (ref 78.0–100.0)
PLATELETS: 428 10*3/uL — AB (ref 150–400)
RBC: 3.91 MIL/uL — AB (ref 4.22–5.81)
RDW: 19.9 % — AB (ref 11.5–15.5)
WBC: 11.3 10*3/uL — AB (ref 4.0–10.5)

## 2015-06-06 MED ORDER — ARIPIPRAZOLE 10 MG PO TABS
10.0000 mg | ORAL_TABLET | Freq: Every day | ORAL | Status: DC
  Administered 2015-06-07 – 2015-06-08 (×2): 10 mg via ORAL
  Filled 2015-06-06 (×2): qty 1

## 2015-06-06 MED ORDER — LORAZEPAM 1 MG PO TABS
1.0000 mg | ORAL_TABLET | Freq: Three times a day (TID) | ORAL | Status: DC | PRN
  Administered 2015-06-07 – 2015-06-08 (×3): 1 mg via ORAL
  Filled 2015-06-06 (×4): qty 1

## 2015-06-06 MED ORDER — GABAPENTIN 300 MG PO CAPS
300.0000 mg | ORAL_CAPSULE | Freq: Three times a day (TID) | ORAL | Status: DC
  Administered 2015-06-07 – 2015-06-08 (×6): 300 mg via ORAL
  Filled 2015-06-06 (×6): qty 1

## 2015-06-06 MED ORDER — HYDROXYZINE HCL 50 MG PO TABS
100.0000 mg | ORAL_TABLET | Freq: Four times a day (QID) | ORAL | Status: DC | PRN
  Filled 2015-06-06: qty 2

## 2015-06-06 MED ORDER — ACETAMINOPHEN 325 MG PO TABS: 650.0000 mg | ORAL_TABLET | ORAL | Status: DC | PRN

## 2015-06-06 MED ORDER — LISINOPRIL 20 MG PO TABS
20.0000 mg | ORAL_TABLET | Freq: Every morning | ORAL | Status: DC
  Administered 2015-06-07 – 2015-06-08 (×2): 20 mg via ORAL
  Filled 2015-06-06 (×2): qty 1

## 2015-06-06 MED ORDER — AMITRIPTYLINE HCL 25 MG PO TABS
50.0000 mg | ORAL_TABLET | Freq: Every day | ORAL | Status: DC
  Administered 2015-06-07 (×2): 50 mg via ORAL
  Filled 2015-06-06 (×2): qty 2

## 2015-06-06 MED ORDER — PANTOPRAZOLE SODIUM 40 MG PO TBEC
40.0000 mg | DELAYED_RELEASE_TABLET | Freq: Every day | ORAL | Status: DC
  Administered 2015-06-07 – 2015-06-08 (×2): 40 mg via ORAL
  Filled 2015-06-06 (×2): qty 1

## 2015-06-06 MED ORDER — ONDANSETRON HCL 4 MG PO TABS
4.0000 mg | ORAL_TABLET | Freq: Three times a day (TID) | ORAL | Status: DC | PRN
  Administered 2015-06-08: 4 mg via ORAL
  Filled 2015-06-06: qty 1

## 2015-06-06 MED ORDER — NICOTINE 21 MG/24HR TD PT24
21.0000 mg | MEDICATED_PATCH | Freq: Every day | TRANSDERMAL | Status: DC
Start: 2015-06-07 — End: 2015-06-08
  Administered 2015-06-07 – 2015-06-08 (×2): 21 mg via TRANSDERMAL
  Filled 2015-06-06 (×2): qty 1
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
## 2015-06-07 LAB — COMPREHENSIVE METABOLIC PANEL
ALK PHOS: 87 U/L (ref 38–126)
ALT: 28 U/L (ref 17–63)
ANION GAP: 12 (ref 5–15)
AST: 35 U/L (ref 15–41)
Albumin: 3.5 g/dL (ref 3.5–5.0)
BILIRUBIN TOTAL: 0.4 mg/dL (ref 0.3–1.2)
BUN: 20 mg/dL (ref 6–20)
CALCIUM: 8.8 mg/dL — AB (ref 8.9–10.3)
CO2: 19 mmol/L — ABNORMAL LOW (ref 22–32)
CREATININE: 1.42 mg/dL — AB (ref 0.61–1.24)
Chloride: 102 mmol/L (ref 101–111)
GFR, EST NON AFRICAN AMERICAN: 56 mL/min — AB (ref >60)
Glucose, Bld: 86 mg/dL (ref 65–99)
Potassium: 4.4 mmol/L (ref 3.5–5.1)
SODIUM: 133 mmol/L — AB (ref 135–145)
TOTAL PROTEIN: 8.1 g/dL (ref 6.5–8.1)

## 2015-06-07 LAB — ETHANOL: ALCOHOL ETHYL (B): 156 mg/dL — AB (ref <5)

## 2015-06-07 LAB — RAPID URINE DRUG SCREEN, HOSP PERFORMED
AMPHETAMINES: NOT DETECTED
BENZODIAZEPINES: NOT DETECTED
Barbiturates: NOT DETECTED
Cocaine: NOT DETECTED
OPIATES: NOT DETECTED
Tetrahydrocannabinol: NOT DETECTED

## 2015-06-07 NOTE — ED Notes (Signed)
Awake. Verbally responsive. Resp even and unlabored. ABC's intact. No behavior problems noted. NAD noted. Pt reported that medication helped with anxiety.

## 2015-06-07 NOTE — ED Notes (Signed)
Resting quietly with eye closed. Easily arousable. Verbally responsive. Resp even and unlabored. ABC's intact. No behavior problems noted. NAD noted. Sitter at bedside.  

## 2015-06-07 NOTE — ED Notes (Signed)
Rise and fall noted to chest. Pt sleeping and not disturbed. Will continue to monitor.

## 2015-06-07 NOTE — ED Notes (Signed)
Resting quietly with eye closed. Easily arousable. Verbally responsive. Resp even and unlabored. ABC's intact. No behavior problems noted. NAD noted.  
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
## 2015-06-07 NOTE — BH Assessment (Signed)
Assessment completed. Pt denies SI, HI and AVH at this time. Pt reported that he has been seeing people walking around; however he is not hearing things. Pt reported that he is not seeing things while in the hospital room. Pt stated "my nerves are really bad". "I need something for my nerves".  Consulted Alberteen Sam, NP who agrees that pt continues to meet inpatient criteria. TTS will continue to seek placement.

## 2015-06-07 NOTE — ED Notes (Signed)
Awake. Verbally responsive. Resp even and unlabored. ABC's intact. No behavior problems noted. Pt requesting medication for anxiety. Noted pt having slight uncontrollable shaking of hands. Plans to give medication.

## 2015-06-07 NOTE — ED Notes (Signed)
TTS at bedside. 

## 2015-06-07 NOTE — ED Notes (Addendum)
Awake. Verbally responsive. Resp even and unlabored. ABC's intact. Pt eaten 100% of lunch. No behavior problems noted. NAD noted. Sitter at bedside.

## 2015-06-07 NOTE — ED Notes (Signed)
Awake. Verbally responsive. Resp even and unlabored. ABC's intact. No behavior problems noted. NAD noted.  
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
## 2015-06-07 NOTE — BHH Counselor (Signed)
Disposition: Per Hulan Fess, NP, pt meets inpt tx criteria. Per Binnie Rail, AC, No male beds at [facility] currently but possibility of discharges tomorrow. TTS to seek placement for pt elsewhere in the meantime.   Earley Favor, NP notified of disposition    Cyndie Mull, [facility] Triage Specialist

## 2015-06-07 NOTE — ED Notes (Signed)
Provided patient with sandwich and sprite. Will continue to monitor.

## 2015-06-07 NOTE — Progress Notes (Signed)
Disposition CSW completed patient referrals to the following inpatient psych facilities:  [facility]  CSW will continue to assist with placement needs.   Seward Speck [facility] Disposition CSW [phone]

## 2015-06-08 NOTE — ED Notes (Signed)
Pt given coffee. °

## 2015-06-08 NOTE — ED Notes (Signed)
Pt quiet, eyes closed, resting.  resp even and unlabored.  NAD noted.

## 2015-06-08 NOTE — Discharge Instructions (Signed)
We saw you in the ER for your mental health concerns and had our behavioral health team evaluate you. You feel comfortable going home thus you are being discharged, please follow the recommendations given to you by them and the follow-up and medications they have prescribed to you. Please refrain from substance abuse. Return to the ER if your symptoms worsen.  SEE YOUR ACT TEAM THIS WEEK. Alcohol Use Disorder Alcohol use disorder is a mental disorder. It is not a one-time incident of heavy drinking. Alcohol use disorder is the excessive and uncontrollable use of alcohol over time that leads to problems with functioning in one or more areas of daily living. People with this disorder risk harming themselves and others when they drink to excess. Alcohol use disorder also can cause other mental disorders, such as mood and anxiety disorders, and serious physical problems. People with alcohol use disorder often misuse other drugs.  Alcohol use disorder is common and widespread. Some people with this disorder drink alcohol to cope with or escape from negative life events. Others drink to relieve chronic pain or symptoms of mental illness. People with a family history of alcohol use disorder are at higher risk of losing control and using alcohol to excess.  SYMPTOMS  Signs and symptoms of alcohol use disorder may include the following:   Consumption ofalcohol inlarger amounts or over a longer period of time than intended.  Multiple unsuccessful attempts to cutdown or control alcohol use.   A great deal of time spent obtaining alcohol, using alcohol, or recovering from the effects of alcohol (hangover).  A strong desire or urge to use alcohol (cravings).   Continued use of alcohol despite problems at work, school, or home because of alcohol use.   Continued use of alcohol despite problems in relationships because of alcohol use.  Continued use of alcohol in situations when it is physically  hazardous, such as driving a car.  Continued use of alcohol despite awareness of a physical or psychological problem that is likely related to alcohol use. Physical problems related to alcohol use can involve the brain, heart, liver, stomach, and intestines. Psychological problems related to alcohol use include intoxication, depression, anxiety, psychosis, delirium, and dementia.   The need for increased amounts of alcohol to achieve the same desired effect, or a decreased effect from the consumption of the same amount of alcohol (tolerance).  Withdrawal symptoms upon reducing or stopping alcohol use, or alcohol use to reduce or avoid withdrawal symptoms. Withdrawal symptoms include:  Racing heart.  Hand tremor.  Difficulty sleeping.  Nausea.  Vomiting.  Hallucinations.  Restlessness.  Seizures. DIAGNOSIS Alcohol use disorder is diagnosed through an assessment by your health care provider. Your health care provider may start by asking three or four questions to screen for excessive or problematic alcohol use. To confirm a diagnosis of alcohol use disorder, at least two symptoms must be present within a 5-month period. The severity of alcohol use disorder depends on the number of symptoms:  Mild--two or three.  Moderate--four or five.  Severe--six or more. Your health care provider may perform a physical exam or use results from lab tests to see if you have physical problems resulting from alcohol use. Your health care provider may refer you to a mental health professional for evaluation. TREATMENT  Some people with alcohol use disorder are able to reduce their alcohol use to low-risk levels. Some people with alcohol use disorder need to quit drinking alcohol. When necessary, mental health professionals with  specialized training in substance use treatment can help. Your health care provider can help you decide how severe your alcohol use disorder is and what type of treatment you  need. The following forms of treatment are available:   Detoxification. Detoxification involves the use of prescription medicines to prevent alcohol withdrawal symptoms in the first week after quitting. This is important for people with a history of symptoms of withdrawal and for heavy drinkers who are likely to have withdrawal symptoms. Alcohol withdrawal can be dangerous and, in severe cases, cause death. Detoxification is usually provided in a hospital or in-patient substance use treatment facility.  Counseling or talk therapy. Talk therapy is provided by substance use treatment counselors. It addresses the reasons people use alcohol and ways to keep them from drinking again. The goals of talk therapy are to help people with alcohol use disorder find healthy activities and ways to cope with life stress, to identify and avoid triggers for alcohol use, and to handle cravings, which can cause relapse.  Medicines.Different medicines can help treat alcohol use disorder through the following actions:  Decrease alcohol cravings.  Decrease the positive reward response felt from alcohol use.  Produce an uncomfortable physical reaction when alcohol is used (aversion therapy).  Support groups. Support groups are run by people who have quit drinking. They provide emotional support, advice, and guidance. These forms of treatment are often combined. Some people with alcohol use disorder benefit from intensive combination treatment provided by specialized substance use treatment centers. Both inpatient and outpatient treatment programs are available. Document Released: 11/11/2004 Document Revised: 02/18/2014 Document Reviewed: 01/11/2013 [facility] Patient Information 2015 [place], [place]. This information is not intended to replace advice given to you by your health care provider. Make sure you discuss any questions you have with your health care provider.

## 2015-06-08 NOTE — ED Notes (Signed)
Pt awake.  Sitting on edge of bed eating breakfast.  Requested to go ahead and take his morning medications while he is eating and also requested an Ativan for "his nerves".  Very cooperative, and thankful for all staffing assistance. Extra cup of coffee given.

## 2015-06-08 NOTE — BHH Counselor (Signed)
BHH Assessment Progress Note  Counselor reassessed pt this morning. He denies current SI or HI. He rates depression and anxiety at a 10, with 10 being the highest level. He endorses AH of seeing people walking through the house. He reports having an ACTT team ([facility]) and that he recently got a new doctor whom he doesn't think really is concerned about him. He feels his medication needs to be adjusted.   Consulted with Dr. Lolly Mustache and Julieanne Cotton, NP. Pt still meets IP criteria. TTS to continue to seek placement.   Johny Shock. Ladona Ridgel, MS, NCC, LPCA Counselor

## 2015-06-08 NOTE — ED Notes (Signed)
Pt awake, sitting in bed.  Pleasant and cooperative, NAD.  No needs at this time per patient.

## 2015-06-08 NOTE — ED Provider Notes (Signed)
Pt wants to go home. States that he feels better. He has no hi, si, hallucinations and is acting properly. Spoke with psych team - and they agree that pt has no criteria to be ivcD and so if he wants to go, he can go and f/u with his act team. No new meds.  Will dc  Filed Vitals:   06/08/15 1455  BP: 106/74  Pulse: 116  Temp:   Resp: 14     Derwood Kaplan, MD 06/08/15 1814

## 2015-12-04 ENCOUNTER — Ambulatory Visit: Payer: Self-pay | Admitting: Family Medicine

## 2016-02-06 ENCOUNTER — Ambulatory Visit: Payer: Self-pay | Admitting: Family Medicine

## 2016-04-23 ENCOUNTER — Emergency Department (HOSPITAL_COMMUNITY): Payer: Medicaid Other

## 2016-04-23 ENCOUNTER — Encounter (HOSPITAL_COMMUNITY): Payer: Self-pay | Admitting: Emergency Medicine

## 2016-04-23 DIAGNOSIS — M79604 Pain in right leg: Secondary | ICD-10-CM | POA: Diagnosis not present

## 2016-04-23 DIAGNOSIS — Z79899 Other long term (current) drug therapy: Secondary | ICD-10-CM | POA: Insufficient documentation

## 2016-04-23 DIAGNOSIS — M79605 Pain in left leg: Secondary | ICD-10-CM | POA: Insufficient documentation

## 2016-04-23 DIAGNOSIS — R0789 Other chest pain: Secondary | ICD-10-CM | POA: Diagnosis not present

## 2016-04-23 DIAGNOSIS — J449 Chronic obstructive pulmonary disease, unspecified: Secondary | ICD-10-CM | POA: Insufficient documentation

## 2016-04-23 DIAGNOSIS — T675XXA Heat exhaustion, unspecified, initial encounter: Secondary | ICD-10-CM | POA: Insufficient documentation

## 2016-04-23 DIAGNOSIS — F17200 Nicotine dependence, unspecified, uncomplicated: Secondary | ICD-10-CM | POA: Insufficient documentation

## 2016-04-23 DIAGNOSIS — M545 Low back pain: Secondary | ICD-10-CM | POA: Diagnosis not present

## 2016-04-23 DIAGNOSIS — I10 Essential (primary) hypertension: Secondary | ICD-10-CM | POA: Insufficient documentation

## 2016-04-23 LAB — CBC
HCT: 37.6 % — ABNORMAL LOW (ref 39.0–52.0)
HEMOGLOBIN: 12.4 g/dL — AB (ref 13.0–17.0)
MCH: 26.1 pg (ref 26.0–34.0)
MCHC: 33 g/dL (ref 30.0–36.0)
MCV: 79.2 fL (ref 78.0–100.0)
Platelets: 317 10*3/uL (ref 150–400)
RBC: 4.75 MIL/uL (ref 4.22–5.81)
RDW: 19.6 % — AB (ref 11.5–15.5)
WBC: 7.2 10*3/uL (ref 4.0–10.5)

## 2016-04-23 LAB — BASIC METABOLIC PANEL
Anion gap: 14 (ref 5–15)
BUN: 15 mg/dL (ref 6–20)
CO2: 19 mmol/L — ABNORMAL LOW (ref 22–32)
Calcium: 9.4 mg/dL (ref 8.9–10.3)
Chloride: 102 mmol/L (ref 101–111)
Creatinine, Ser: 1.28 mg/dL — ABNORMAL HIGH (ref 0.61–1.24)
GFR calc Af Amer: >60 mL/min (ref >60)
Glucose, Bld: 88 mg/dL (ref 65–99)
Potassium: 3.8 mmol/L (ref 3.5–5.1)
SODIUM: 135 mmol/L (ref 135–145)

## 2016-04-23 LAB — I-STAT TROPONIN, ED: TROPONIN I, POC: 0 ng/mL (ref 0.00–0.08)

## 2016-04-23 NOTE — ED Notes (Signed)
Patient here with lower back pain and bilateral leg pain.  Patient also admitted to chest pain upon arrival to ED to EMS.  Patient was outside during the day, he has been walking around during the day.  Patient does have some ETOH on board and has two pairs of pants on today.  Patient is tachycardic upon arrival to ED.  Patient is CAOx4.  Patient has not been taking his regular medications per EMS, such as Gabapentin for his pain.  Patient is out of his meds for a long time.

## 2016-04-24 ENCOUNTER — Encounter (HOSPITAL_COMMUNITY): Payer: Self-pay

## 2016-04-24 ENCOUNTER — Emergency Department (HOSPITAL_COMMUNITY): Payer: Medicaid Other

## 2016-04-24 ENCOUNTER — Emergency Department (HOSPITAL_COMMUNITY)
Admission: EM | Admit: 2016-04-24 | Discharge: 2016-04-24 | Disposition: A | Discharge status: Home / Self Care | Payer: Medicaid Other | Source: Home / Self Care | Attending: Emergency Medicine | Admitting: Emergency Medicine

## 2016-04-24 ENCOUNTER — Emergency Department (HOSPITAL_COMMUNITY)
Admission: EM | Admit: 2016-04-24 | Discharge: 2016-04-24 | Disposition: A | Discharge status: Home / Self Care | Payer: Medicaid Other | Attending: Emergency Medicine | Admitting: Emergency Medicine

## 2016-04-24 DIAGNOSIS — F17200 Nicotine dependence, unspecified, uncomplicated: Secondary | ICD-10-CM | POA: Insufficient documentation

## 2016-04-24 DIAGNOSIS — I10 Essential (primary) hypertension: Secondary | ICD-10-CM

## 2016-04-24 DIAGNOSIS — F1010 Alcohol abuse, uncomplicated: Secondary | ICD-10-CM | POA: Insufficient documentation

## 2016-04-24 DIAGNOSIS — T675XXA Heat exhaustion, unspecified, initial encounter: Secondary | ICD-10-CM

## 2016-04-24 DIAGNOSIS — J449 Chronic obstructive pulmonary disease, unspecified: Secondary | ICD-10-CM | POA: Insufficient documentation

## 2016-04-24 DIAGNOSIS — R0789 Other chest pain: Secondary | ICD-10-CM

## 2016-04-24 LAB — I-STAT TROPONIN, ED: Troponin i, poc: 0 ng/mL (ref 0.00–0.08)

## 2016-04-24 LAB — D-DIMER, QUANTITATIVE (NOT AT ARMC): D DIMER QUANT: 0.62 ug{FEU}/mL — AB (ref 0.00–0.50)

## 2016-04-24 MED ORDER — LORAZEPAM 1 MG PO TABS: 0.0000 mg | ORAL_TABLET | Freq: Two times a day (BID) | ORAL | Status: DC

## 2016-04-24 MED ORDER — HYDROCODONE-ACETAMINOPHEN 5-325 MG PO TABS
1.0000 | ORAL_TABLET | Freq: Once | ORAL | Status: AC
  Administered 2016-04-24: 1 via ORAL
  Filled 2016-04-24: qty 1

## 2016-04-24 MED ORDER — THIAMINE HCL 100 MG/ML IJ SOLN: 100.0000 mg | Freq: Every day | INTRAMUSCULAR | Status: DC

## 2016-04-24 MED ORDER — VITAMIN B-1 100 MG PO TABS: 100.0000 mg | ORAL_TABLET | Freq: Every day | ORAL | Status: DC

## 2016-04-24 MED ORDER — LORAZEPAM 1 MG PO TABS: 0.0000 mg | ORAL_TABLET | Freq: Four times a day (QID) | ORAL | Status: DC

## 2016-04-24 MED ORDER — IBUPROFEN 400 MG PO TABS: 400.0000 mg | ORAL_TABLET | Freq: Four times a day (QID) | ORAL | Status: DC | PRN

## 2016-04-24 MED ORDER — SODIUM CHLORIDE 0.9 % IV BOLUS (SEPSIS)
1000.0000 mL | Freq: Once | INTRAVENOUS | Status: AC
  Administered 2016-04-24: 1000 mL via INTRAVENOUS

## 2016-04-24 MED ORDER — IOPAMIDOL (ISOVUE-370) INJECTION 76%
INTRAVENOUS | Status: AC
  Administered 2016-04-24: 100 mL
  Filled 2016-04-24: qty 100

## 2016-04-24 NOTE — ED Notes (Addendum)
Patient here after being discharged this am and wants detox from ETOH. Last drink yesterday. Patient states he was waiting outside ED for bus and decided that he doesn't want to go back to drinking. Requesting admission. Alert and oriented. Had labs collected this am from previous visit

## 2016-04-24 NOTE — ED Notes (Signed)
Fluid challenge completed, pt tolerated fluids without getting nauseous or vomiting.
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
## 2016-04-24 NOTE — Discharge Instructions (Signed)
Community Resource Guide Inpatient Behavioral Health/Residential  °Substance Abuse Treatment °Adults °The [employer]’s “211” is a great source of information about community services available.  Access by dialing 2-1-1 from anywhere in [place], or by website -  www.nc211.org.  ° °(Updated 10/2015) ° °Crisis Assistance °24 hours a day °  °Services Offered ° °  °Area Served  °[facility] • 24-hour crisis assistance: 800-939-5911 [place], [place]  ° [facility] • 24-hour crisis assistance:336-342-8316 [place], [place]  °[facility] ° • 24-hour crisis assistance: 336-676-6840 [place], [place] °  °[facility] to Care Line • 24-hour crisis assistance; 800-256-2452 All °  °[facility] • 24-hour crisis response line: 877-626-1772 All  ° °Other Local Resources (Updated 10/2015) ° °Inpatient Behavioral Health/Residential Substance Abuse Treatment Programs °  °Services  ° ° °  °Address and Phone Number  °[facility] ° • 14-day residential rehabilitation  919-575-7928 °[address] °[place], [place]  °[facility])  ° • Detox - private pay only °• 14-day residential rehabilitation -  Medicaid, insurance, private pay only 336-784-9470, or °877-615-2722 °[address], [place], [place] [address]   °[facility] • Private Insurance only °• Multiple facilities [phone] admissions °  °[facility]) ° • 90-day program °• Must be homeless to participate ° 336-725-8389, or °800-758-6077 °[place], [place]  °[facility] ° ° ° • Private Insurance only 828-575-2701, or  °844-684-9200 °[address] °[place], [place] [address]  °[facility] ° ° ° • Must make an appointment °• Transportation is offered from [employer] on [address] °• Accepts private pay, Medicare, [place] Medicaid [phone]  °[address] [place], [place] [address]   °[facility] • Females only °• Associated with [facility] [place] [facility] 704-333-HOPE (4673) °[address] °[place], [place] [address]  °[facility] ° • Private insurance only 336-621-3381, or °800-659-3381 °[address] °[place], [place]  °[facility] ° ° • Detox °• Residential rehabilitation °• Private insurance only °• Multiple locations [phone] admissions  °[facility] [place] ° ° • Private pay °• Private insurance [phone] °[address] °[place] [address]  °[facility] ° ° • Males only °• Fee required at time of admission [phone] °[address] °[place], [place] [address]  °[facility] ° ° • Private pay only ° [phone] °[address] °[place], [place]  °[facility]  ° • Detox - private pay, Medicaid °• Residential rehabilitation for males  - Medicare, Medicaid, insurance, private pay 336-227-7417 °[address] °[place], [place]   °[facility]  ° • Walk-in interviews Monday - Saturday from 8 am - 4 pm °• Individuals with legal charges are not eligible [phone] °[address] °[place], [place] [address]  °[facility]  • Must be willing to work °• Must attend Alcoholics Anonymous meetings [phone] °[address] °[place], [place]   °[place] [facility]  ° • Faith-based program °• Private pay only 336-725-1848 °[address] °[place], [place]  ° °

## 2016-04-24 NOTE — Discharge Instructions (Signed)
Nonspecific Chest Pain  °Chest pain can be caused by many different conditions. There is always a chance that your pain could be related to something serious, such as a heart attack or a blood clot in your lungs. Chest pain can also be caused by conditions that are not life-threatening. If you have chest pain, it is very important to follow up with your health care provider. °CAUSES  °Chest pain can be caused by: °· Heartburn. °· Pneumonia or bronchitis. °· Anxiety or stress. °· Inflammation around your heart (pericarditis) or lung (pleuritis or pleurisy). °· A blood clot in your lung. °· A collapsed lung (pneumothorax). It can develop suddenly on its own (spontaneous pneumothorax) or from trauma to the chest. °· Shingles infection (varicella-zoster virus). °· Heart attack. °· Damage to the bones, muscles, and cartilage that make up your chest wall. This can include: °¨ Bruised bones due to injury. °¨ Strained muscles or cartilage due to frequent or repeated coughing or overwork. °¨ Fracture to one or more ribs. °¨ Sore cartilage due to inflammation (costochondritis). °RISK FACTORS  °Risk factors for chest pain may include: °· Activities that increase your risk for trauma or injury to your chest. °· Respiratory infections or conditions that cause frequent coughing. °· Medical conditions or overeating that can cause heartburn. °· Heart disease or family history of heart disease. °· Conditions or health behaviors that increase your risk of developing a blood clot. °· Having had chicken pox (varicella zoster). °SIGNS AND SYMPTOMS °Chest pain can feel like: °· Burning or tingling on the surface of your chest or deep in your chest. °· Crushing, pressure, aching, or squeezing pain. °· Dull or sharp pain that is worse when you move, cough, or take a deep breath. °· Pain that is also felt in your back, neck, shoulder, or arm, or pain that spreads to any of these areas. °Your chest pain may come and go, or it may stay  constant. °DIAGNOSIS °Lab tests or other studies may be needed to find the cause of your pain. Your health care provider may have you take a test called an ambulatory ECG (electrocardiogram). An ECG records your heartbeat patterns at the time the test is performed. You may also have other tests, such as: °· Transthoracic echocardiogram (TTE). During echocardiography, sound waves are used to create a picture of all of the heart structures and to look at how blood flows through your heart. °· Transesophageal echocardiogram (TEE). This is a more advanced imaging test that obtains images from inside your body. It allows your health care provider to see your heart in finer detail. °· Cardiac monitoring. This allows your health care provider to monitor your heart rate and rhythm in real time. °· Holter monitor. This is a portable device that records your heartbeat and can help to diagnose abnormal heartbeats. It allows your health care provider to track your heart activity for several days, if needed. °· Stress tests. These can be done through exercise or by taking medicine that makes your heart beat more quickly. °· Blood tests. °· Imaging tests. °TREATMENT  °Your treatment depends on what is causing your chest pain. Treatment may include: °· Medicines. These may include: °¨ Acid blockers for heartburn. °¨ Anti-inflammatory medicine. °¨ Pain medicine for inflammatory conditions. °¨ Antibiotic medicine, if an infection is present. °¨ Medicines to dissolve blood clots. °¨ Medicines to treat coronary artery disease. °· Supportive care for conditions that do not require medicines. This may include: °¨ Resting. °¨ Applying heat   or cold packs to injured areas. °¨ Limiting activities until pain decreases. °HOME CARE INSTRUCTIONS °· If you were prescribed an antibiotic medicine, finish it all even if you start to feel better. °· Avoid any activities that bring on chest pain. °· Do not use any tobacco products, including  cigarettes, chewing tobacco, or electronic cigarettes. If you need help quitting, ask your health care provider. °· Do not drink alcohol. °· Take medicines only as directed by your health care provider. °· Keep all follow-up visits as directed by your health care provider. This is important. This includes any further testing if your chest pain does not go away. °· If heartburn is the cause for your chest pain, you may be told to keep your head raised (elevated) while sleeping. This reduces the chance that acid will go from your stomach into your esophagus. °· Make lifestyle changes as directed by your health care provider. These may include: °¨ Getting regular exercise. Ask your health care provider to suggest some activities that are safe for you. °¨ Eating a heart-healthy diet. A registered dietitian can help you to learn healthy eating options. °¨ Maintaining a healthy weight. °¨ Managing diabetes, if necessary. °¨ Reducing stress. °SEEK MEDICAL CARE IF: °· Your chest pain does not go away after treatment. °· You have a rash with blisters on your chest. °· You have a fever. °SEEK IMMEDIATE MEDICAL CARE IF:  °· Your chest pain is worse. °· You have an increasing cough, or you cough up blood. °· You have severe abdominal pain. °· You have severe weakness. °· You faint. °· You have chills. °· You have sudden, unexplained chest discomfort. °· You have sudden, unexplained discomfort in your arms, back, neck, or jaw. °· You have shortness of breath at any time. °· You suddenly start to sweat, or your skin gets clammy. °· You feel nauseous or you vomit. °· You suddenly feel light-headed or dizzy. °· Your heart begins to beat quickly, or it feels like it is skipping beats. °These symptoms may represent a serious problem that is an emergency. Do not wait to see if the symptoms will go away. Get medical help right away. Call your local emergency services (911 in the [place] Do not drive yourself to the hospital. °  °This  information is not intended to replace advice given to you by your health care provider. Make sure you discuss any questions you have with your health care provider. °  °Document Released: 07/14/2005 Document Revised: 10/25/2014 Document Reviewed: 05/10/2014 °Elsevier Interactive Patient Education ©2016 [employer] ° °
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
## 2018-03-15 ENCOUNTER — Emergency Department (HOSPITAL_COMMUNITY): Payer: Medicaid Other

## 2018-03-15 ENCOUNTER — Emergency Department (HOSPITAL_COMMUNITY)
Admission: EM | Admit: 2018-03-15 | Discharge: 2018-03-16 | Disposition: A | Discharge status: Home / Self Care | Payer: Medicaid Other | Attending: Emergency Medicine | Admitting: Emergency Medicine

## 2018-03-15 ENCOUNTER — Encounter (HOSPITAL_COMMUNITY): Payer: Self-pay | Admitting: Emergency Medicine

## 2018-03-15 DIAGNOSIS — I10 Essential (primary) hypertension: Secondary | ICD-10-CM | POA: Diagnosis not present

## 2018-03-15 DIAGNOSIS — Z590 Homelessness: Secondary | ICD-10-CM | POA: Insufficient documentation

## 2018-03-15 DIAGNOSIS — R1110 Vomiting, unspecified: Secondary | ICD-10-CM

## 2018-03-15 DIAGNOSIS — F10120 Alcohol abuse with intoxication, uncomplicated: Secondary | ICD-10-CM | POA: Insufficient documentation

## 2018-03-15 DIAGNOSIS — F17210 Nicotine dependence, cigarettes, uncomplicated: Secondary | ICD-10-CM | POA: Insufficient documentation

## 2018-03-15 DIAGNOSIS — J449 Chronic obstructive pulmonary disease, unspecified: Secondary | ICD-10-CM | POA: Diagnosis not present

## 2018-03-15 DIAGNOSIS — R55 Syncope and collapse: Secondary | ICD-10-CM | POA: Insufficient documentation

## 2018-03-15 DIAGNOSIS — Y908 Blood alcohol level of 240 mg/100 ml or more: Secondary | ICD-10-CM | POA: Insufficient documentation

## 2018-03-15 DIAGNOSIS — R109 Unspecified abdominal pain: Secondary | ICD-10-CM | POA: Insufficient documentation

## 2018-03-15 DIAGNOSIS — R197 Diarrhea, unspecified: Secondary | ICD-10-CM | POA: Insufficient documentation

## 2018-03-15 DIAGNOSIS — R112 Nausea with vomiting, unspecified: Secondary | ICD-10-CM | POA: Insufficient documentation

## 2018-03-15 DIAGNOSIS — F10920 Alcohol use, unspecified with intoxication, uncomplicated: Secondary | ICD-10-CM

## 2018-03-15 DIAGNOSIS — F1010 Alcohol abuse, uncomplicated: Secondary | ICD-10-CM

## 2018-03-15 LAB — RAPID URINE DRUG SCREEN, HOSP PERFORMED
Amphetamines: NOT DETECTED
Barbiturates: NOT DETECTED
Benzodiazepines: NOT DETECTED
Cocaine: NOT DETECTED
OPIATES: NOT DETECTED
Tetrahydrocannabinol: POSITIVE — AB

## 2018-03-15 LAB — COMPREHENSIVE METABOLIC PANEL
ALT: 31 U/L (ref 17–63)
AST: 77 U/L — AB (ref 15–41)
Albumin: 3.2 g/dL — ABNORMAL LOW (ref 3.5–5.0)
Alkaline Phosphatase: 127 U/L — ABNORMAL HIGH (ref 38–126)
Anion gap: 13 (ref 5–15)
BILIRUBIN TOTAL: 0.3 mg/dL (ref 0.3–1.2)
BUN: 6 mg/dL (ref 6–20)
CO2: 22 mmol/L (ref 22–32)
CREATININE: 0.65 mg/dL (ref 0.61–1.24)
Calcium: 8.6 mg/dL — ABNORMAL LOW (ref 8.9–10.3)
Chloride: 107 mmol/L (ref 101–111)
GFR calc non Af Amer: >60 mL/min (ref >60)
Glucose, Bld: 95 mg/dL (ref 65–99)
Potassium: 3.6 mmol/L (ref 3.5–5.1)
Sodium: 142 mmol/L (ref 135–145)
TOTAL PROTEIN: 8.1 g/dL (ref 6.5–8.1)

## 2018-03-15 LAB — CBC WITH DIFFERENTIAL/PLATELET
BASOS ABS: 0.1 10*3/uL (ref 0.0–0.1)
Basophils Relative: 1 %
EOS PCT: 1 %
Eosinophils Absolute: 0.1 10*3/uL (ref 0.0–0.7)
HEMATOCRIT: 37.6 % — AB (ref 39.0–52.0)
Hemoglobin: 13.2 g/dL (ref 13.0–17.0)
LYMPHS ABS: 1.9 10*3/uL (ref 0.7–4.0)
Lymphocytes Relative: 27 %
MCH: 34.1 pg — AB (ref 26.0–34.0)
MCHC: 35.1 g/dL (ref 30.0–36.0)
MCV: 97.2 fL (ref 78.0–100.0)
MONO ABS: 0.8 10*3/uL (ref 0.1–1.0)
Monocytes Relative: 11 %
NEUTROS ABS: 4.3 10*3/uL (ref 1.7–7.7)
Neutrophils Relative %: 60 %
PLATELETS: 263 10*3/uL (ref 150–400)
RBC: 3.87 MIL/uL — ABNORMAL LOW (ref 4.22–5.81)
RDW: 16.5 % — AB (ref 11.5–15.5)
WBC: 7 10*3/uL (ref 4.0–10.5)

## 2018-03-15 LAB — URINALYSIS, ROUTINE W REFLEX MICROSCOPIC
BILIRUBIN URINE: NEGATIVE
GLUCOSE, UA: NEGATIVE mg/dL
Ketones, ur: NEGATIVE mg/dL
NITRITE: POSITIVE — AB
PH: 7 (ref 5.0–8.0)
Protein, ur: NEGATIVE mg/dL
SPECIFIC GRAVITY, URINE: 1.011 (ref 1.005–1.030)

## 2018-03-15 LAB — ETHANOL: Alcohol, Ethyl (B): 345 mg/dL (ref <10)

## 2018-03-15 LAB — LIPASE, BLOOD: Lipase: 35 U/L (ref 11–51)

## 2018-03-15 NOTE — ED Triage Notes (Signed)
Pt comes to ed , via ems, c/o ETOH and N/v. Dehydration. Pt takes about 3 shots of hard liquor a day if not more. alert x3 V/s on arrival are 131/92, cbg98, spo2 96  16 g left AC.

## 2018-03-15 NOTE — ED Notes (Signed)
Bed: MV78 Expected date:  Expected time:  Means of arrival:  Comments: 57 yo M  Near syncopal episode
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
## 2018-03-15 NOTE — Discharge Instructions (Addendum)
As discussed, your evaluation today has been largely reassuring.  But, it is important that you monitor your condition carefully, and do not hesitate to return to the ED if you develop new, or concerning changes in your condition.  Please use the provided resources to obtain assistance.

## 2018-03-16 MED ORDER — ONDANSETRON 4 MG PO TBDP
4.0000 mg | ORAL_TABLET | Freq: Once | ORAL | Status: AC
  Administered 2018-03-16: 4 mg via ORAL
  Filled 2018-03-16: qty 1

## 2018-03-16 NOTE — ED Provider Notes (Signed)
11:00 - Sobering - ok to discharge when clinically sober  2:00 - resting comfortably.  4:30 - no complaints, sleeping  6:00 - complains of nausea. Zofran ordered. Will check vital signs for any evidence of withdrawal. No tremors. Oriented. He has eaten and is drinking water.   7:00 - he is ambulatory and steady. No tremors. VSS, mildly hypertensive. He can be discharge home per plan of previous treatment team.    Elpidio Anis, PA-C 03/16/18 9528    Gerhard Munch, MD 03/18/18 1534

## 2018-03-16 NOTE — ED Notes (Signed)
Rn obtained Pt some socks from [employer].  Pt assisted out in a wheelchair after reviewing d/c paperwork.

## 2018-03-16 NOTE — ED Notes (Signed)
Pt walked to bathroom with RN assistance. Pt stable and not shaking at this time

## 2018-04-05 ENCOUNTER — Emergency Department (HOSPITAL_COMMUNITY)
Admission: EM | Admit: 2018-04-05 | Discharge: 2018-04-05 | Disposition: A | Discharge status: Home / Self Care | Payer: Medicaid Other | Attending: Emergency Medicine | Admitting: Emergency Medicine

## 2018-04-05 ENCOUNTER — Other Ambulatory Visit: Payer: Self-pay

## 2018-04-05 ENCOUNTER — Encounter (HOSPITAL_COMMUNITY): Payer: Self-pay | Admitting: *Deleted

## 2018-04-05 ENCOUNTER — Emergency Department (HOSPITAL_COMMUNITY): Payer: Medicaid Other

## 2018-04-05 DIAGNOSIS — I10 Essential (primary) hypertension: Secondary | ICD-10-CM | POA: Diagnosis not present

## 2018-04-05 DIAGNOSIS — J449 Chronic obstructive pulmonary disease, unspecified: Secondary | ICD-10-CM | POA: Insufficient documentation

## 2018-04-05 DIAGNOSIS — R0602 Shortness of breath: Secondary | ICD-10-CM | POA: Diagnosis not present

## 2018-04-05 DIAGNOSIS — F17200 Nicotine dependence, unspecified, uncomplicated: Secondary | ICD-10-CM | POA: Insufficient documentation

## 2018-04-05 LAB — CBC
HEMATOCRIT: 35.2 % — AB (ref 39.0–52.0)
HEMOGLOBIN: 11.3 g/dL — AB (ref 13.0–17.0)
MCH: 31.8 pg (ref 26.0–34.0)
MCHC: 32.1 g/dL (ref 30.0–36.0)
MCV: 99.2 fL (ref 78.0–100.0)
Platelets: 305 10*3/uL (ref 150–400)
RBC: 3.55 MIL/uL — ABNORMAL LOW (ref 4.22–5.81)
RDW: 15.3 % (ref 11.5–15.5)
WBC: 8.7 10*3/uL (ref 4.0–10.5)

## 2018-04-05 LAB — I-STAT TROPONIN, ED: Troponin i, poc: 0 ng/mL (ref 0.00–0.08)

## 2018-04-05 LAB — BASIC METABOLIC PANEL
ANION GAP: 9 (ref 5–15)
BUN: 6 mg/dL (ref 6–20)
CHLORIDE: 108 mmol/L (ref 101–111)
CO2: 20 mmol/L — ABNORMAL LOW (ref 22–32)
Calcium: 8.8 mg/dL — ABNORMAL LOW (ref 8.9–10.3)
Creatinine, Ser: 0.59 mg/dL — ABNORMAL LOW (ref 0.61–1.24)
GFR calc Af Amer: >60 mL/min (ref >60)
GFR calc non Af Amer: >60 mL/min (ref >60)
GLUCOSE: 112 mg/dL — AB (ref 65–99)
POTASSIUM: 3.5 mmol/L (ref 3.5–5.1)
SODIUM: 137 mmol/L (ref 135–145)

## 2018-04-05 MED ORDER — ALBUTEROL SULFATE HFA 108 (90 BASE) MCG/ACT IN AERS
2.0000 | INHALATION_SPRAY | Freq: Once | RESPIRATORY_TRACT | Status: AC
  Administered 2018-04-05: 2 via RESPIRATORY_TRACT
  Filled 2018-04-05: qty 6.7
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
## 2018-04-05 NOTE — ED Notes (Signed)
ED Provider at bedside. 

## 2018-04-05 NOTE — Discharge Instructions (Signed)
Follow-up with your primary care doctor. Try to stop smoking, use inhaler when needed. Return here for any new/acute changes.

## 2018-04-05 NOTE — ED Notes (Signed)
Pt reports he had onset of shortness of breath and what felt like wheezing just prior to ems transport. He reports chronic pain in his ankles. He has not been taking his prescribed meds. +ETOH today.

## 2018-04-05 NOTE — ED Notes (Signed)
Patient transported to X-ray 

## 2018-04-25 ENCOUNTER — Encounter (HOSPITAL_COMMUNITY): Payer: Self-pay | Admitting: Emergency Medicine

## 2018-04-25 ENCOUNTER — Emergency Department (HOSPITAL_COMMUNITY): Payer: Medicaid Other

## 2018-04-25 ENCOUNTER — Emergency Department (HOSPITAL_COMMUNITY)
Admission: EM | Admit: 2018-04-25 | Discharge: 2018-04-25 | Disposition: A | Discharge status: Home / Self Care | Payer: Medicaid Other | Attending: Emergency Medicine | Admitting: Emergency Medicine

## 2018-04-25 DIAGNOSIS — Y939 Activity, unspecified: Secondary | ICD-10-CM | POA: Insufficient documentation

## 2018-04-25 DIAGNOSIS — I10 Essential (primary) hypertension: Secondary | ICD-10-CM | POA: Diagnosis not present

## 2018-04-25 DIAGNOSIS — S39012A Strain of muscle, fascia and tendon of lower back, initial encounter: Secondary | ICD-10-CM | POA: Diagnosis not present

## 2018-04-25 DIAGNOSIS — Y929 Unspecified place or not applicable: Secondary | ICD-10-CM | POA: Diagnosis not present

## 2018-04-25 DIAGNOSIS — Y999 Unspecified external cause status: Secondary | ICD-10-CM | POA: Diagnosis not present

## 2018-04-25 DIAGNOSIS — J449 Chronic obstructive pulmonary disease, unspecified: Secondary | ICD-10-CM | POA: Insufficient documentation

## 2018-04-25 DIAGNOSIS — F17210 Nicotine dependence, cigarettes, uncomplicated: Secondary | ICD-10-CM | POA: Diagnosis not present

## 2018-04-25 DIAGNOSIS — S3982XA Other specified injuries of lower back, initial encounter: Secondary | ICD-10-CM | POA: Diagnosis present
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
## 2020-06-15 ENCOUNTER — Emergency Department (HOSPITAL_COMMUNITY)
Admission: EM | Admit: 2020-06-15 | Discharge: 2020-06-16 | Disposition: A | Discharge status: Home / Self Care | Payer: Medicaid Other | Attending: Emergency Medicine | Admitting: Emergency Medicine

## 2020-06-15 DIAGNOSIS — R69 Illness, unspecified: Secondary | ICD-10-CM

## 2020-06-15 DIAGNOSIS — Z20822 Contact with and (suspected) exposure to covid-19: Secondary | ICD-10-CM | POA: Insufficient documentation

## 2020-06-15 DIAGNOSIS — F17210 Nicotine dependence, cigarettes, uncomplicated: Secondary | ICD-10-CM | POA: Diagnosis not present

## 2020-06-15 DIAGNOSIS — J449 Chronic obstructive pulmonary disease, unspecified: Secondary | ICD-10-CM | POA: Insufficient documentation

## 2020-06-15 DIAGNOSIS — R112 Nausea with vomiting, unspecified: Secondary | ICD-10-CM | POA: Diagnosis not present
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
## 2020-06-16 ENCOUNTER — Emergency Department (HOSPITAL_COMMUNITY): Payer: Medicaid Other
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
## 2020-12-25 IMAGING — DX DG CHEST 2V
2 series · 2 of 2 positions shown · non-contrast
Comparison: 04/05/2018

CLINICAL DATA: Shortness of breath.  Generalized illness

EXAM:
CHEST - 2 VIEW

[chest pa]
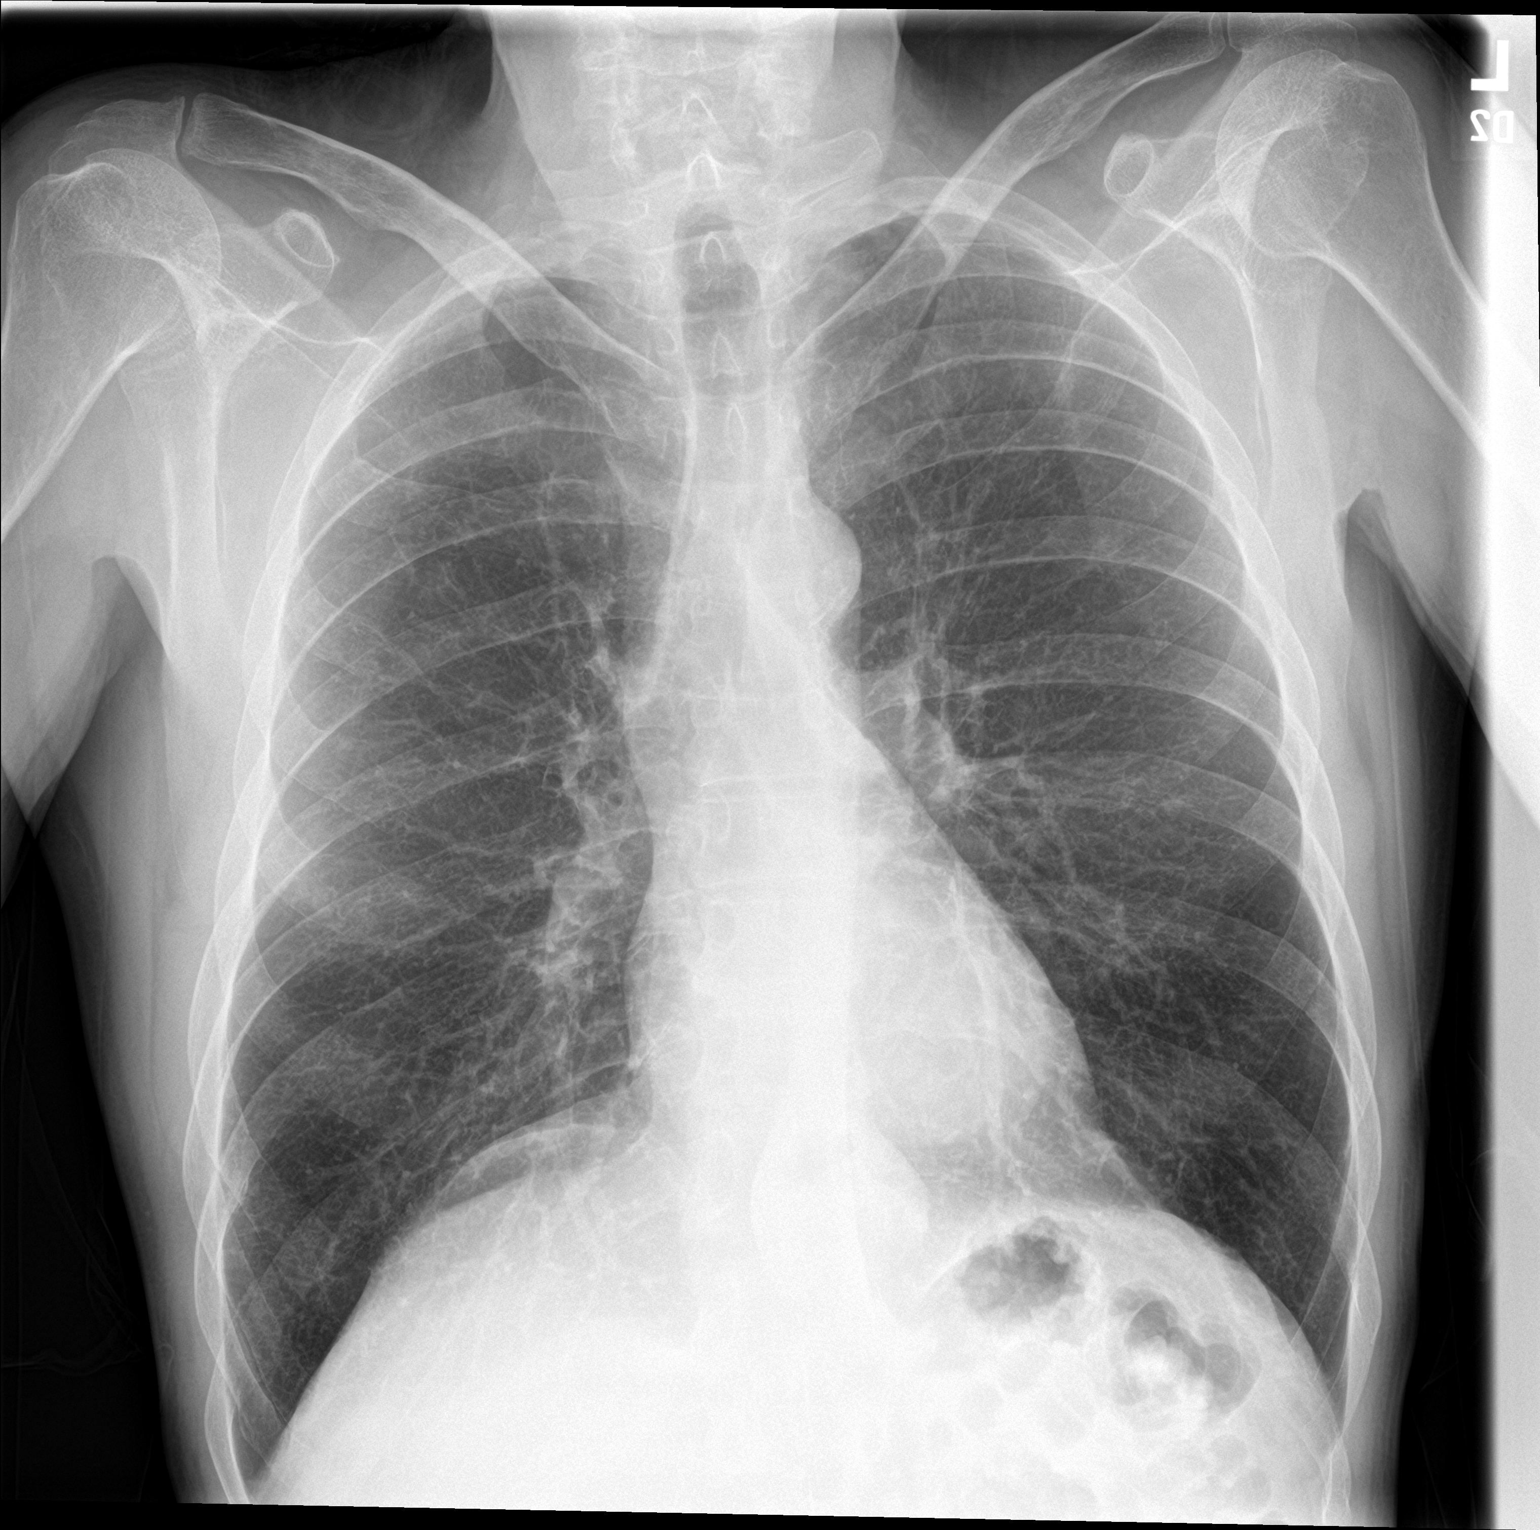

[chest lat]
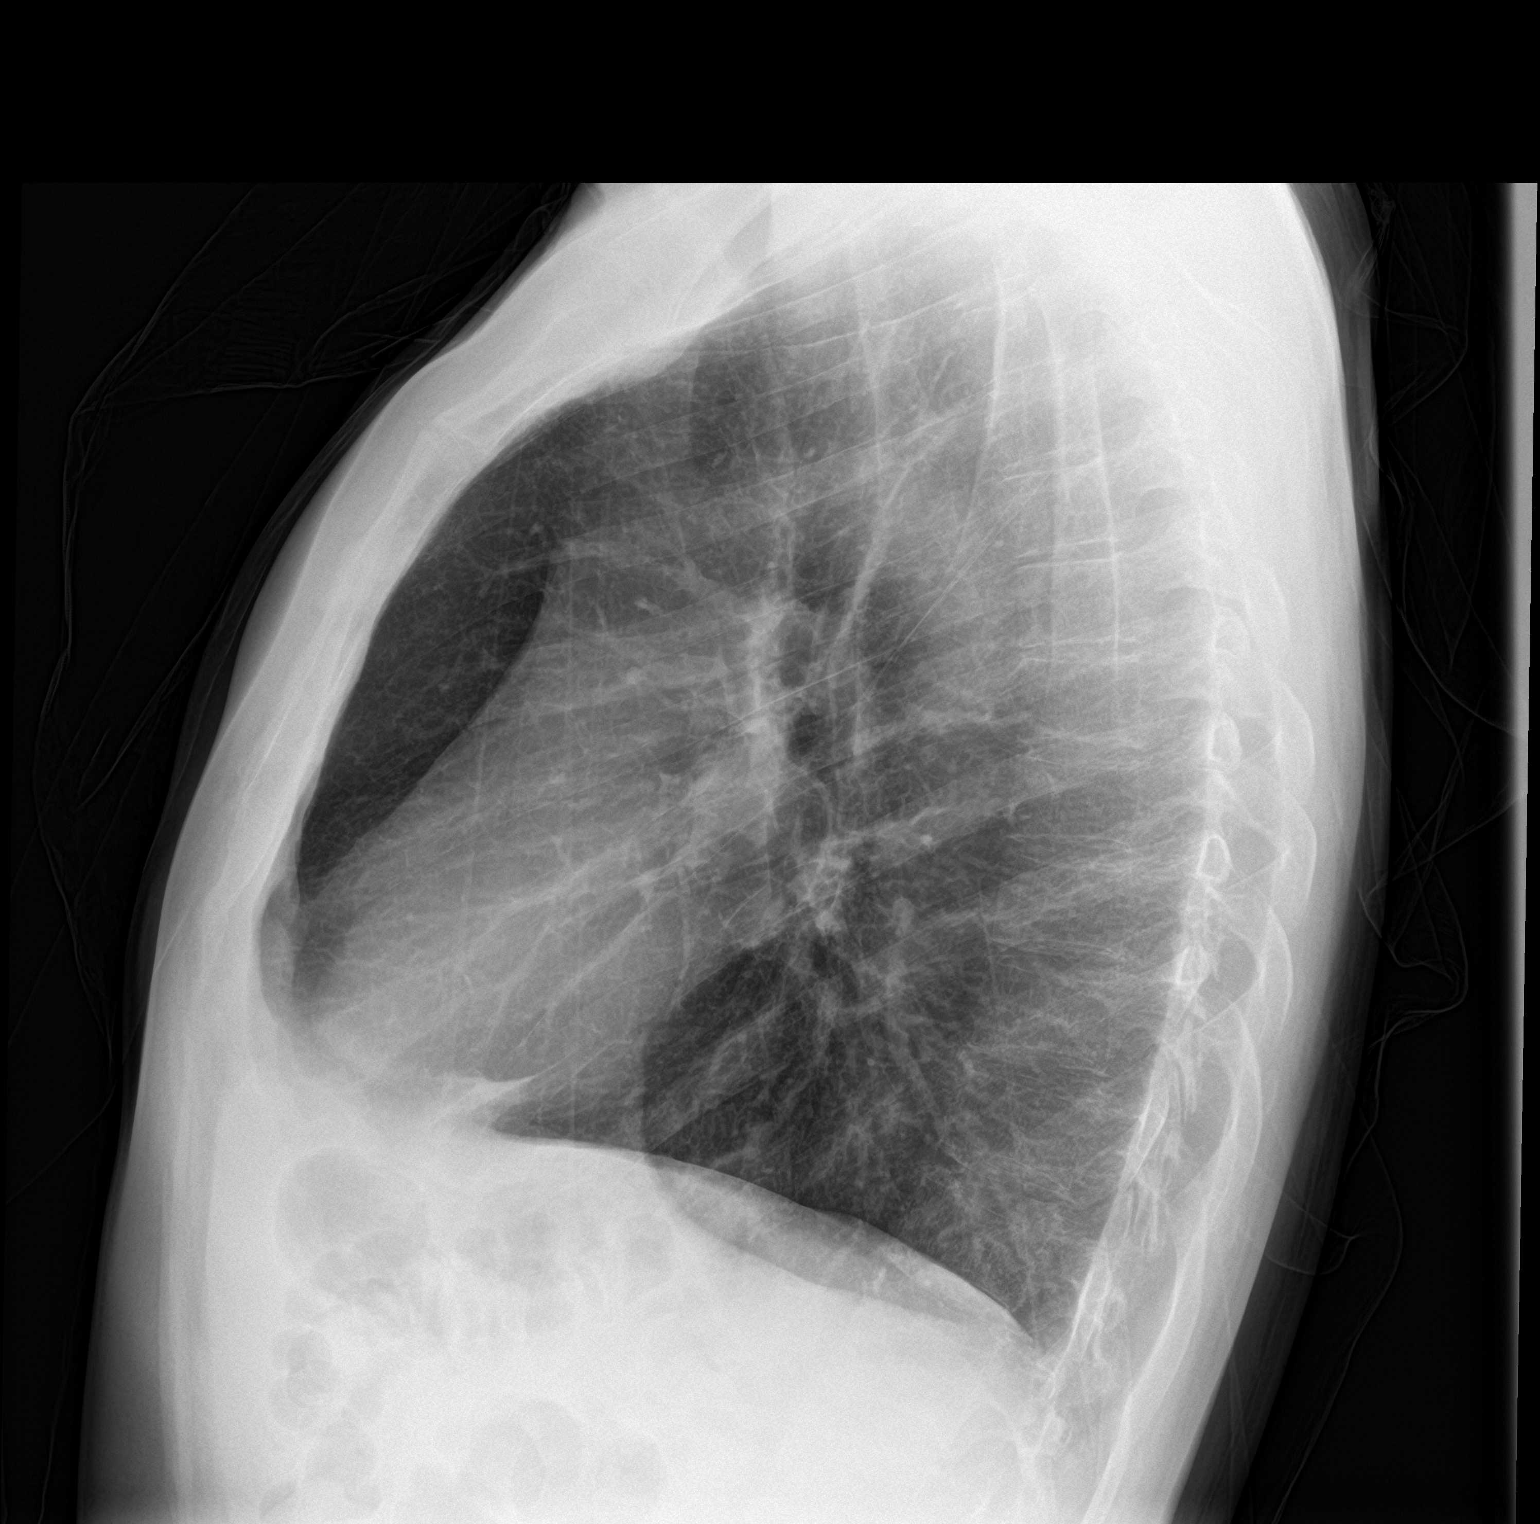

[2 of 2 positions shown; findings below may reference images not displayed]

FINDINGS: Small hiatal hernia containing gas. Mild hyperinflation. There is no
edema, consolidation, effusion, or pneumothorax. Normal heart size
and mediastinal contours.
IMPRESSION: COPD without acute superimposed finding.

## 2023-07-24 ENCOUNTER — Emergency Department (HOSPITAL_COMMUNITY): Payer: MEDICAID

## 2023-07-24 ENCOUNTER — Encounter (HOSPITAL_COMMUNITY): Payer: Self-pay

## 2023-07-24 ENCOUNTER — Inpatient Hospital Stay (HOSPITAL_COMMUNITY)
Admission: EM | Admit: 2023-07-24 | Discharge: 2023-07-28 | DRG: 603 | Disposition: A | Discharge status: Home / Self Care | Payer: MEDICAID | Attending: Family Medicine | Admitting: Family Medicine

## 2023-07-24 ENCOUNTER — Other Ambulatory Visit: Payer: Self-pay

## 2023-07-24 DIAGNOSIS — Z886 Allergy status to analgesic agent status: Secondary | ICD-10-CM | POA: Diagnosis not present

## 2023-07-24 DIAGNOSIS — W25XXXA Contact with sharp glass, initial encounter: Secondary | ICD-10-CM | POA: Diagnosis present

## 2023-07-24 DIAGNOSIS — L03113 Cellulitis of right upper limb: Principal | ICD-10-CM | POA: Diagnosis present

## 2023-07-24 DIAGNOSIS — F10229 Alcohol dependence with intoxication, unspecified: Secondary | ICD-10-CM | POA: Diagnosis present

## 2023-07-24 DIAGNOSIS — Z881 Allergy status to other antibiotic agents status: Secondary | ICD-10-CM

## 2023-07-24 DIAGNOSIS — J4489 Other specified chronic obstructive pulmonary disease: Secondary | ICD-10-CM | POA: Diagnosis present

## 2023-07-24 DIAGNOSIS — F17200 Nicotine dependence, unspecified, uncomplicated: Secondary | ICD-10-CM | POA: Diagnosis present

## 2023-07-24 DIAGNOSIS — K219 Gastro-esophageal reflux disease without esophagitis: Secondary | ICD-10-CM | POA: Diagnosis present

## 2023-07-24 DIAGNOSIS — Z23 Encounter for immunization: Secondary | ICD-10-CM

## 2023-07-24 DIAGNOSIS — Z880 Allergy status to penicillin: Secondary | ICD-10-CM

## 2023-07-24 DIAGNOSIS — J449 Chronic obstructive pulmonary disease, unspecified: Secondary | ICD-10-CM | POA: Diagnosis not present

## 2023-07-24 DIAGNOSIS — F419 Anxiety disorder, unspecified: Secondary | ICD-10-CM | POA: Diagnosis present

## 2023-07-24 DIAGNOSIS — S41111A Laceration without foreign body of right upper arm, initial encounter: Secondary | ICD-10-CM

## 2023-07-24 DIAGNOSIS — E876 Hypokalemia: Secondary | ICD-10-CM | POA: Diagnosis present

## 2023-07-24 DIAGNOSIS — S51811A Laceration without foreign body of right forearm, initial encounter: Secondary | ICD-10-CM | POA: Diagnosis present

## 2023-07-24 DIAGNOSIS — F1010 Alcohol abuse, uncomplicated: Secondary | ICD-10-CM | POA: Diagnosis present

## 2023-07-24 DIAGNOSIS — L0390 Cellulitis, unspecified: Secondary | ICD-10-CM | POA: Diagnosis present

## 2023-07-24 DIAGNOSIS — F1020 Alcohol dependence, uncomplicated: Secondary | ICD-10-CM | POA: Diagnosis present

## 2023-07-24 DIAGNOSIS — Z5982 Transportation insecurity: Secondary | ICD-10-CM

## 2023-07-24 DIAGNOSIS — Z8249 Family history of ischemic heart disease and other diseases of the circulatory system: Secondary | ICD-10-CM | POA: Diagnosis not present

## 2023-07-24 DIAGNOSIS — E872 Acidosis, unspecified: Secondary | ICD-10-CM | POA: Diagnosis present

## 2023-07-24 DIAGNOSIS — I10 Essential (primary) hypertension: Secondary | ICD-10-CM | POA: Diagnosis present

## 2023-07-24 DIAGNOSIS — Z5900 Homelessness unspecified: Secondary | ICD-10-CM | POA: Diagnosis not present

## 2023-07-24 DIAGNOSIS — Y907 Blood alcohol level of 200-239 mg/100 ml: Secondary | ICD-10-CM | POA: Diagnosis present

## 2023-07-24 LAB — CBC WITH DIFFERENTIAL/PLATELET
Abs Immature Granulocytes: 0.02 10*3/uL (ref 0.00–0.07)
Basophils Absolute: 0.1 10*3/uL (ref 0.0–0.1)
Basophils Relative: 1 %
Eosinophils Absolute: 0.2 10*3/uL (ref 0.0–0.5)
Eosinophils Relative: 2 %
HCT: 41.4 % (ref 39.0–52.0)
Hemoglobin: 13.7 g/dL (ref 13.0–17.0)
Immature Granulocytes: 0 %
Lymphocytes Relative: 34 %
Lymphs Abs: 3 10*3/uL (ref 0.7–4.0)
MCH: 29.1 pg (ref 26.0–34.0)
MCHC: 33.1 g/dL (ref 30.0–36.0)
MCV: 87.9 fL (ref 80.0–100.0)
Monocytes Absolute: 0.6 10*3/uL (ref 0.1–1.0)
Monocytes Relative: 7 %
Neutro Abs: 5 10*3/uL (ref 1.7–7.7)
Neutrophils Relative %: 56 %
Platelets: 342 10*3/uL (ref 150–400)
RBC: 4.71 MIL/uL (ref 4.22–5.81)
RDW: 16.1 % — ABNORMAL HIGH (ref 11.5–15.5)
WBC: 8.9 10*3/uL (ref 4.0–10.5)
nRBC: 0 % (ref 0.0–0.2)

## 2023-07-24 LAB — COMPREHENSIVE METABOLIC PANEL
ALT: 21 U/L (ref 0–44)
AST: 36 U/L (ref 15–41)
Albumin: 4 g/dL (ref 3.5–5.0)
Alkaline Phosphatase: 99 U/L (ref 38–126)
Anion gap: 13 (ref 5–15)
BUN: <5 mg/dL — ABNORMAL LOW (ref 6–20)
CO2: 23 mmol/L (ref 22–32)
Calcium: 8.7 mg/dL — ABNORMAL LOW (ref 8.9–10.3)
Chloride: 102 mmol/L (ref 98–111)
Creatinine, Ser: 0.54 mg/dL — ABNORMAL LOW (ref 0.61–1.24)
GFR, Estimated: >60 mL/min (ref >60)
Glucose, Bld: 107 mg/dL — ABNORMAL HIGH (ref 70–99)
Potassium: 3.1 mmol/L — ABNORMAL LOW (ref 3.5–5.1)
Sodium: 138 mmol/L (ref 135–145)
Total Bilirubin: 0.4 mg/dL (ref 0.3–1.2)
Total Protein: 8.9 g/dL — ABNORMAL HIGH (ref 6.5–8.1)

## 2023-07-24 LAB — PROTIME-INR
INR: 1 (ref 0.8–1.2)
Prothrombin Time: 12.8 s (ref 11.4–15.2)

## 2023-07-24 LAB — ETHANOL: Alcohol, Ethyl (B): 222 mg/dL — ABNORMAL HIGH (ref <10)

## 2023-07-24 LAB — I-STAT CG4 LACTIC ACID, ED
Lactic Acid, Venous: 2.3 mmol/L (ref 0.5–1.9)
Lactic Acid, Venous: 2.3 mmol/L (ref 0.5–1.9)

## 2023-07-24 LAB — APTT: aPTT: 32 s (ref 24–36)

## 2023-07-24 MED ORDER — VANCOMYCIN HCL IN DEXTROSE 1-5 GM/200ML-% IV SOLN
1000.0000 mg | Freq: Once | INTRAVENOUS | Status: AC
  Administered 2023-07-24: 1000 mg via INTRAVENOUS
  Filled 2023-07-24: qty 200

## 2023-07-24 MED ORDER — TETANUS-DIPHTH-ACELL PERTUSSIS 5-2.5-18.5 LF-MCG/0.5 IM SUSY
0.5000 mL | PREFILLED_SYRINGE | Freq: Once | INTRAMUSCULAR | Status: AC
  Administered 2023-07-24: 0.5 mL via INTRAMUSCULAR
  Filled 2023-07-24: qty 0.5

## 2023-07-24 MED ORDER — SODIUM CHLORIDE 0.9 % IV SOLN
2.0000 g | Freq: Once | INTRAVENOUS | Status: AC
  Administered 2023-07-24: 2 g via INTRAVENOUS
  Filled 2023-07-24: qty 12.5

## 2023-07-24 MED ORDER — THIAMINE MONONITRATE 100 MG PO TABS
100.0000 mg | ORAL_TABLET | Freq: Every day | ORAL | Status: DC
  Administered 2023-07-24 – 2023-07-28 (×5): 100 mg via ORAL
  Filled 2023-07-24 (×5): qty 1

## 2023-07-24 MED ORDER — THIAMINE HCL 100 MG/ML IJ SOLN: 100.0000 mg | Freq: Every day | INTRAMUSCULAR | Status: DC

## 2023-07-24 MED ORDER — VANCOMYCIN HCL 750 MG/150ML IV SOLN
750.0000 mg | Freq: Two times a day (BID) | INTRAVENOUS | Status: DC
  Administered 2023-07-25 – 2023-07-27 (×5): 750 mg via INTRAVENOUS
  Filled 2023-07-24 (×5): qty 150

## 2023-07-24 MED ORDER — ADULT MULTIVITAMIN W/MINERALS CH
1.0000 | ORAL_TABLET | Freq: Every day | ORAL | Status: DC
  Administered 2023-07-24 – 2023-07-28 (×5): 1 via ORAL
  Filled 2023-07-24 (×5): qty 1

## 2023-07-24 MED ORDER — POTASSIUM CHLORIDE CRYS ER 20 MEQ PO TBCR
40.0000 meq | EXTENDED_RELEASE_TABLET | Freq: Once | ORAL | Status: AC
  Administered 2023-07-24: 40 meq via ORAL
  Filled 2023-07-24: qty 2

## 2023-07-24 MED ORDER — CALCIUM CARBONATE 1250 (500 CA) MG PO TABS
1.0000 | ORAL_TABLET | Freq: Once | ORAL | Status: AC
  Administered 2023-07-24: 1250 mg via ORAL
  Filled 2023-07-24: qty 1

## 2023-07-24 MED ORDER — FOLIC ACID 1 MG PO TABS
1.0000 mg | ORAL_TABLET | Freq: Every day | ORAL | Status: DC
  Administered 2023-07-24 – 2023-07-28 (×5): 1 mg via ORAL
  Filled 2023-07-24 (×5): qty 1

## 2023-07-24 MED ORDER — LORAZEPAM 2 MG/ML IJ SOLN
1.0000 mg | INTRAMUSCULAR | Status: AC | PRN
  Administered 2023-07-24: 2 mg via INTRAVENOUS
  Filled 2023-07-24: qty 1

## 2023-07-24 MED ORDER — LACTATED RINGERS IV BOLUS (SEPSIS)
1000.0000 mL | Freq: Once | INTRAVENOUS | Status: AC
  Administered 2023-07-24: 1000 mL via INTRAVENOUS

## 2023-07-24 MED ORDER — LORAZEPAM 1 MG PO TABS
1.0000 mg | ORAL_TABLET | ORAL | Status: AC | PRN
  Administered 2023-07-25: 2 mg via ORAL

## 2023-07-24 MED ORDER — SODIUM CHLORIDE 0.9 % IV SOLN
2.0000 g | Freq: Three times a day (TID) | INTRAVENOUS | Status: DC
  Administered 2023-07-25 – 2023-07-27 (×8): 2 g via INTRAVENOUS
  Filled 2023-07-24 (×8): qty 12.5

## 2023-07-24 NOTE — CV Procedure (Signed)
WOC Nurse Consult Note: Reason for Consult: Right distal forearm laceration three days ago. Has been treating with neosporin.   Wound type: trauma, infectious  Pressure Injury POA: NA Measurement: 4 cm x 1 cm x 1 cm  Wound bed: yellow fibrin with creamy drainage noted Drainage (amount, consistency, odor)  creamy, purulent Periwound: erythema Dressing procedure/placement/frequency: Cleanse laceration to left forearm with VASHE cleanser.  Apply VASHE moist gauze to wound bed. Cover with dry gauze and kerlix/tape.  Change daily. Will not follow at this time.  Please re-consult if needed.  Mike Gip MSN, RN, FNP-BC CWON Wound, Ostomy, Continence Nurse Outpatient [facility] [phone] Pager [phone]

## 2023-07-24 NOTE — Assessment & Plan Note (Signed)
-  did not disclose amount but just that he "drinks a good amount." Reports last drink yesterday (10/6) -place on CIWA protocol

## 2023-07-24 NOTE — Progress Notes (Signed)
Pharmacy Antibiotic Note  Terry Bradley is a homeless 62 y.o. male admitted on 07/24/2023 for infected wound with noted purulent drainage.  Pharmacy has been consulted for vancomycin and cefepime dosing.  Plan: Vancomycin 1000 mg IV now, then 750 mg IV q12 hr (est AUC 482 based on SCr 0.8; Vd 0.72) Measure vancomycin AUC at steady state as indicated SCr q48 while on vanc Cefepime 2 g IV q8 hr   Height: 5\' 9"  (175.3 cm) Weight: 59 kg (130 lb) IBW/kg (Calculated) : 70.7  Temp (24hrs), Avg:98.4 F (36.9 C), Min:97.8 F (36.6 C), Max:98.9 F (37.2 C)  Recent Labs  Lab 07/24/23 1704 07/24/23 1750 07/24/23 1926  WBC 8.9  --   --   CREATININE 0.54*  --   --   LATICACIDVEN  --  2.3* 2.3*    Estimated Creatinine Clearance: 83 mL/min (A) (by C-G formula based on SCr of 0.54 mg/dL (L)).    Allergies  Allergen Reactions   Aspirin Hives and Other (See Comments)    GI upset   Penicillins Swelling and Other (See Comments)    Has patient had a PCN reaction causing immediate rash, facial/tongue/throat swelling, SOB or lightheadedness with hypotension: Yes Has patient had a PCN reaction causing severe rash involving mucus membranes or skin necrosis: Yes Has patient had a PCN reaction that required hospitalization No Has patient had a PCN reaction occurring within the last 10 years: No If all of the above answers are "NO", then may proceed with Cephalosporin use. GI upset    Ampicillin Swelling    Has patient had a PCN reaction causing immediate rash, facial/tongue/throat swelling, SOB or lightheadedness with hypotension: Yes Has patient had a PCN reaction causing severe rash involving mucus membranes or skin necrosis: Yes Has patient had a PCN reaction that required hospitalization No Has patient had a PCN reaction occurring within the last 10 years: No If all of the above answers are "NO", then may proceed with Cephalosporin use.     Antimicrobials this admission: 10/6 Vancomycin >>   10/6 Cefepime >>   Dose adjustments this admission: N/a  Microbiology results: 10/6 BCx: sent   Thank you for allowing pharmacy to be a part of this patient's care.  Teresa Nicodemus A 07/24/2023 9:17 PM

## 2023-07-24 NOTE — Assessment & Plan Note (Signed)
-  replace with oral potassium -also will obtain Mg level

## 2023-07-24 NOTE — Assessment & Plan Note (Signed)
-  not in exacerbation 

## 2023-07-24 NOTE — Assessment & Plan Note (Signed)
-   gapping laceration to distal ulnar right forearm just proximal to the wrist with spreading erythema towards up mid-forearm. Good range of motion to wrist. Low suspicion of joint infection.  -has received Tetanus vaccination -will need consult to General surgery for debridement and wash out  -wound care consulted -continue IV vancomycin and Cefepime
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
## 2023-07-24 NOTE — Assessment & Plan Note (Signed)
-  replace with oral supplementation

## 2023-07-24 NOTE — ED Triage Notes (Signed)
[facility]. Patient also has lac to right forearm. Patient A&Ox4 at time of triage.

## 2023-07-25 DIAGNOSIS — L0390 Cellulitis, unspecified: Secondary | ICD-10-CM | POA: Diagnosis not present

## 2023-07-25 DIAGNOSIS — F1010 Alcohol abuse, uncomplicated: Secondary | ICD-10-CM | POA: Diagnosis not present

## 2023-07-25 DIAGNOSIS — L03113 Cellulitis of right upper limb: Secondary | ICD-10-CM

## 2023-07-25 DIAGNOSIS — S41111A Laceration without foreign body of right upper arm, initial encounter: Secondary | ICD-10-CM | POA: Diagnosis not present

## 2023-07-25 LAB — BASIC METABOLIC PANEL
Anion gap: 10 (ref 5–15)
BUN: 7 mg/dL (ref 6–20)
CO2: 24 mmol/L (ref 22–32)
Calcium: 8.8 mg/dL — ABNORMAL LOW (ref 8.9–10.3)
Chloride: 102 mmol/L (ref 98–111)
Creatinine, Ser: 0.61 mg/dL (ref 0.61–1.24)
GFR, Estimated: >60 mL/min (ref >60)
Glucose, Bld: 88 mg/dL (ref 70–99)
Potassium: 4.3 mmol/L (ref 3.5–5.1)
Sodium: 136 mmol/L (ref 135–145)

## 2023-07-25 LAB — MAGNESIUM: Magnesium: 1.7 mg/dL (ref 1.7–2.4)

## 2023-07-25 LAB — HIV ANTIBODY (ROUTINE TESTING W REFLEX): HIV Screen 4th Generation wRfx: NONREACTIVE

## 2023-07-25 MED ORDER — LORAZEPAM 1 MG PO TABS
0.0000 mg | ORAL_TABLET | Freq: Four times a day (QID) | ORAL | Status: AC
  Administered 2023-07-26 (×2): 1 mg via ORAL
  Filled 2023-07-25: qty 1
  Filled 2023-07-25: qty 2
  Filled 2023-07-25: qty 1

## 2023-07-25 MED ORDER — MAGNESIUM SULFATE 2 GM/50ML IV SOLN
2.0000 g | Freq: Once | INTRAVENOUS | Status: AC
  Administered 2023-07-25: 2 g via INTRAVENOUS
  Filled 2023-07-25: qty 50

## 2023-07-25 MED ORDER — LORAZEPAM 1 MG PO TABS: 0.0000 mg | ORAL_TABLET | Freq: Two times a day (BID) | ORAL | Status: DC
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
## 2023-07-25 NOTE — TOC CM/SW Note (Signed)
Transition of Care [facility]) - Inpatient Brief Assessment   Patient Details  Name: Terry Bradley MRN: [id number] Date of Birth: November 17, 1960  Transition of Care [facility]) CM/SW Contact:    Otelia Santee, LCSW Phone Number: 07/25/2023, 11:23 AM   Clinical Narrative: Met with pt to offer SA resources. Pt agreeable to resources being placed on AVS. Pt currently active with [facility].    Transition of Care Asessment: Insurance and Status: Insurance coverage has been reviewed Patient has primary care physician: Yes Home environment has been reviewed: Home Prior level of function:: Independent Prior/Current Home Services: No current home services Social Determinants of Health Reivew: SDOH reviewed no interventions necessary Readmission risk has been reviewed: Yes Transition of care needs: transition of care needs identified, TOC will continue to follow

## 2023-07-26 DIAGNOSIS — S41111A Laceration without foreign body of right upper arm, initial encounter: Secondary | ICD-10-CM | POA: Diagnosis not present

## 2023-07-26 DIAGNOSIS — L0390 Cellulitis, unspecified: Secondary | ICD-10-CM | POA: Diagnosis not present

## 2023-07-26 DIAGNOSIS — F1010 Alcohol abuse, uncomplicated: Secondary | ICD-10-CM | POA: Diagnosis not present

## 2023-07-26 DIAGNOSIS — L03113 Cellulitis of right upper limb: Secondary | ICD-10-CM | POA: Diagnosis not present

## 2023-07-26 LAB — CBC
HCT: 41 % (ref 39.0–52.0)
Hemoglobin: 13.2 g/dL (ref 13.0–17.0)
MCH: 29.5 pg (ref 26.0–34.0)
MCHC: 32.2 g/dL (ref 30.0–36.0)
MCV: 91.5 fL (ref 80.0–100.0)
Platelets: 278 10*3/uL (ref 150–400)
RBC: 4.48 MIL/uL (ref 4.22–5.81)
RDW: 15.9 % — ABNORMAL HIGH (ref 11.5–15.5)
WBC: 8.2 10*3/uL (ref 4.0–10.5)
nRBC: 0 % (ref 0.0–0.2)

## 2023-07-26 LAB — COMPREHENSIVE METABOLIC PANEL
ALT: 20 U/L (ref 0–44)
AST: 31 U/L (ref 15–41)
Albumin: 3.1 g/dL — ABNORMAL LOW (ref 3.5–5.0)
Alkaline Phosphatase: 95 U/L (ref 38–126)
Anion gap: 12 (ref 5–15)
BUN: 6 mg/dL (ref 6–20)
CO2: 23 mmol/L (ref 22–32)
Calcium: 8.9 mg/dL (ref 8.9–10.3)
Chloride: 100 mmol/L (ref 98–111)
Creatinine, Ser: 0.48 mg/dL — ABNORMAL LOW (ref 0.61–1.24)
GFR, Estimated: >60 mL/min (ref >60)
Glucose, Bld: 99 mg/dL (ref 70–99)
Potassium: 4.1 mmol/L (ref 3.5–5.1)
Sodium: 135 mmol/L (ref 135–145)
Total Bilirubin: 0.7 mg/dL (ref 0.3–1.2)
Total Protein: 7.4 g/dL (ref 6.5–8.1)

## 2023-07-26 LAB — MAGNESIUM: Magnesium: 2.1 mg/dL (ref 1.7–2.4)

## 2023-07-26 MED ORDER — QUETIAPINE FUMARATE 100 MG PO TABS
200.0000 mg | ORAL_TABLET | Freq: Every day | ORAL | Status: DC
  Administered 2023-07-26 – 2023-07-27 (×2): 200 mg via ORAL
  Filled 2023-07-26 (×2): qty 2

## 2023-07-26 MED ORDER — LITHIUM CARBONATE ER 450 MG PO TBCR
750.0000 mg | EXTENDED_RELEASE_TABLET | Freq: Every day | ORAL | Status: DC
  Administered 2023-07-26 – 2023-07-27 (×2): 750 mg via ORAL
  Filled 2023-07-26 (×2): qty 1

## 2023-07-26 NOTE — Plan of Care (Signed)
?  Problem: Clinical Measurements: ?Goal: Will remain free from infection ?Outcome: Progressing ?Goal: Diagnostic test results will improve ?Outcome: Progressing ?  ?Problem: Activity: ?Goal: Risk for activity intolerance will decrease ?Outcome: Progressing ?  ?Problem: Coping: ?Goal: Level of anxiety will decrease ?Outcome: Progressing ?  ?
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
## 2023-07-26 NOTE — Evaluation (Signed)
Physical Therapy Evaluation Patient Details Name: Terry Bradley MRN: [id number] DOB: 1961/08/16 Today's Date: 07/26/2023  History of Present Illness  62 yo male presents to therapy following hospital admission on 07/24/2023 due to R forearm laceration that has become more painful, red and edemous. Pt was found to have R UE cellulitis. Pt has PMH including but not limited to: HTN, COPD, GI bleed, GERD, anxiety, and hx of substance abuse.  Clinical Impression      Patient evaluated by Physical Therapy with no further acute PT needs identified. All education has been completed and the patient has no further questions. Pt is mod I no AD for all functional mobility tasks including bed mobility, transfers and gait tasks in hallway 250 feet, no over LOB. Pt reports R side rib pain > than R LE pain. Pt left in bed all needs in place. See below for any follow-up Physical Therapy or DME needs. PT is signing off. Thank you for this referral.      If plan is discharge home, recommend the following: Assist for transportation   Can travel by private vehicle        Equipment Recommendations None recommended by PT  Recommendations for Other Services       Functional Status Assessment Patient has not had a recent decline in their functional status     Precautions / Restrictions Precautions Precautions: Fall Restrictions Weight Bearing Restrictions: No      Mobility  Bed Mobility Overal bed mobility: Modified Independent                  Transfers Overall transfer level: Modified independent Equipment used: None               General transfer comment: pt reports he has been able to get up OOB and amb to/from bathroom mod I no AD since asmission    Ambulation/Gait Ambulation/Gait assistance: Modified independent (Device/Increase time) Gait Distance (Feet): 250 Feet Assistive device: None Gait Pattern/deviations: Step-through pattern, Staggering left, Staggering right        General Gait Details: occational drift with ambulation, no overt LOB, no reports of SOB, dizziness or increased pain with functional mobility tasks. pt demonstrates good reciprocal pattern with LEs passing in stance phase and appropriate foot clearance  Stairs            Wheelchair Mobility     Tilt Bed    Modified Rankin (Stroke Patients Only)       Balance Overall balance assessment: No apparent balance deficits (not formally assessed) (no reports of falls)                                           Pertinent Vitals/Pain Pain Assessment Pain Assessment: 0-10 Pain Score: 8  Pain Location: R ribs Pain Descriptors / Indicators: Aching, Constant, Discomfort Pain Intervention(s): Limited activity within patient's tolerance, Monitored during session    Home Living Family/patient expects to be discharged to:: Private residence Living Arrangements: Alone Available Help at Discharge: Family;Friend(s) Type of Home: House Home Access: Stairs to enter Entrance Stairs-Rails: Left Entrance Stairs-Number of Steps: 3   Home Layout: One level Home Equipment: None Additional Comments: pt able to demonstrate ability to complete lower body dressing pants and socks IND seated EOB at time of eval    Prior Function Prior Level of Function : Independent/Modified Independent  Mobility Comments: IND no AD for all ADLs, self care tasks and IADLs ADLs Comments: pt does have some assist with meals     Extremity/Trunk Assessment        Lower Extremity Assessment Lower Extremity Assessment: Overall WFL for tasks assessed    Cervical / Trunk Assessment Cervical / Trunk Assessment:  (head forward)  Communication   Communication Communication: No apparent difficulties  Cognition Arousal: Alert Behavior During Therapy: WFL for tasks assessed/performed Overall Cognitive Status: Within Functional Limits for tasks assessed                                           General Comments      Exercises     Assessment/Plan    PT Assessment Patient does not need any further PT services  PT Problem List         PT Treatment Interventions      PT Goals (Current goals can be found in the Care Plan section)  Acute Rehab PT Goals Patient Stated Goal: to be able to get back home PT Goal Formulation: With patient Time For Goal Achievement: 08/09/23 Potential to Achieve Goals: Good    Frequency       Co-evaluation               AM-PAC PT "6 Clicks" Mobility  Outcome Measure Help needed turning from your back to your side while in a flat bed without using bedrails?: None Help needed moving from lying on your back to sitting on the side of a flat bed without using bedrails?: None Help needed moving to and from a bed to a chair (including a wheelchair)?: None Help needed standing up from a chair using your arms (e.g., wheelchair or bedside chair)?: None Help needed to walk in hospital room?: None Help needed climbing 3-5 steps with a railing? : A Little 6 Click Score: 23    End of Session Equipment Utilized During Treatment: Gait belt Activity Tolerance: Patient tolerated treatment well Patient left: in bed;with call bell/phone within reach Nurse Communication: Mobility status PT Visit Diagnosis: Unsteadiness on feet (R26.81);Pain Pain - Right/Left: Right Pain - part of body:  (ribs)    Time: 0981-1914 PT Time Calculation (min) (ACUTE ONLY): 17 min   Charges:   PT Evaluation $PT Eval Low Complexity: 1 Low   PT General Charges $$ ACUTE PT VISIT: 1 Visit         Johnny Bridge, PT Acute Rehab   Jacqualyn Posey 07/26/2023, 5:23 PM

## 2023-07-26 NOTE — Plan of Care (Signed)
  Problem: Education: Goal: Knowledge of General Education information will improve Description: Including pain rating scale, medication(s)/side effects and non-pharmacologic comfort measures Outcome: Progressing   Problem: Health Behavior/Discharge Planning: Goal: Ability to manage health-related needs will improve Outcome: Progressing   Problem: Clinical Measurements: Goal: Will remain free from infection Outcome: Progressing Goal: Respiratory complications will improve Outcome: Progressing Goal: Cardiovascular complication will be avoided Outcome: Progressing   Problem: Nutrition: Goal: Adequate nutrition will be maintained Outcome: Progressing   Problem: Coping: Goal: Level of anxiety will decrease Outcome: Progressing   Problem: Elimination: Goal: Will not experience complications related to bowel motility Outcome: Progressing Goal: Will not experience complications related to urinary retention Outcome: Progressing   Problem: Pain Managment: Goal: General experience of comfort will improve Outcome: Progressing   Problem: Safety: Goal: Ability to remain free from injury will improve Outcome: Progressing   Problem: Skin Integrity: Goal: Risk for impaired skin integrity will decrease Outcome: Progressing

## 2023-07-26 NOTE — TOC Progression Note (Signed)
Transition of Care [facility]) - Progression Note    Patient Details  Name: Terry Bradley MRN: [id number] Date of Birth: 02-24-1961  [facility] CM/SW Contact  Otelia Santee, LCSW Phone Number: 07/26/2023, 12:42 PM  Clinical Narrative:    Sherron Monday with [place] at [facility] ACTT who has requested pt's discharge summary be faxed to them at [phone] at discharge.         Expected Discharge Plan and Services                                               Social Determinants of Health (SDOH) Interventions SDOH Screenings   Food Insecurity: No Food Insecurity (07/25/2023)  Housing: Patient Declined (07/25/2023)  Transportation Needs: No Transportation Needs (07/25/2023)  Utilities: Patient Declined (07/25/2023)  Tobacco Use: High Risk (07/24/2023)    Readmission Risk Interventions    07/25/2023   11:23 AM  Readmission Risk Prevention Plan  Post Dischage Appt Complete  Medication Screening Complete  Transportation Screening Complete

## 2023-07-27 DIAGNOSIS — L0390 Cellulitis, unspecified: Secondary | ICD-10-CM | POA: Diagnosis not present

## 2023-07-27 LAB — COMPREHENSIVE METABOLIC PANEL
ALT: 19 U/L (ref 0–44)
AST: 24 U/L (ref 15–41)
Albumin: 3.1 g/dL — ABNORMAL LOW (ref 3.5–5.0)
Alkaline Phosphatase: 89 U/L (ref 38–126)
Anion gap: 12 (ref 5–15)
BUN: 10 mg/dL (ref 6–20)
CO2: 21 mmol/L — ABNORMAL LOW (ref 22–32)
Calcium: 9.1 mg/dL (ref 8.9–10.3)
Chloride: 102 mmol/L (ref 98–111)
Creatinine, Ser: 0.46 mg/dL — ABNORMAL LOW (ref 0.61–1.24)
GFR, Estimated: >60 mL/min (ref >60)
Glucose, Bld: 91 mg/dL (ref 70–99)
Potassium: 3.7 mmol/L (ref 3.5–5.1)
Sodium: 135 mmol/L (ref 135–145)
Total Bilirubin: 0.4 mg/dL (ref 0.3–1.2)
Total Protein: 7.6 g/dL (ref 6.5–8.1)

## 2023-07-27 LAB — CBC
HCT: 40 % (ref 39.0–52.0)
Hemoglobin: 13.2 g/dL (ref 13.0–17.0)
MCH: 29.2 pg (ref 26.0–34.0)
MCHC: 33 g/dL (ref 30.0–36.0)
MCV: 88.5 fL (ref 80.0–100.0)
Platelets: 288 10*3/uL (ref 150–400)
RBC: 4.52 MIL/uL (ref 4.22–5.81)
RDW: 15.9 % — ABNORMAL HIGH (ref 11.5–15.5)
WBC: 8 10*3/uL (ref 4.0–10.5)
nRBC: 0 % (ref 0.0–0.2)

## 2023-07-27 MED ORDER — DOXYCYCLINE HYCLATE 100 MG PO TABS
100.0000 mg | ORAL_TABLET | Freq: Two times a day (BID) | ORAL | Status: DC
  Administered 2023-07-27 – 2023-07-28 (×2): 100 mg via ORAL
  Filled 2023-07-27 (×3): qty 1

## 2023-07-27 MED ORDER — CEFADROXIL 500 MG PO CAPS
500.0000 mg | ORAL_CAPSULE | Freq: Two times a day (BID) | ORAL | Status: DC
  Administered 2023-07-27 – 2023-07-28 (×2): 500 mg via ORAL
  Filled 2023-07-27 (×2): qty 1

## 2023-07-27 MED ORDER — PROPRANOLOL HCL 20 MG PO TABS
20.0000 mg | ORAL_TABLET | Freq: Two times a day (BID) | ORAL | Status: DC
  Administered 2023-07-27 – 2023-07-28 (×3): 20 mg via ORAL
  Filled 2023-07-27 (×3): qty 1

## 2023-07-27 NOTE — Progress Notes (Signed)
[facility] PROGRESS NOTE  Damion Kant (DOB: 08/30/1961) WUJ:[id number] PCP: Patient, No Pcp Per  Brief Narrative: Terry Bradley is a 62 y.o. male with a history of COPD, HTN, GI bleed, GERD, anxiety who presented to the ED on 07/24/2023 with infection of a wound   Subjective: Redness, pain and swelling to the right arm is improving. No fevers. He is RHD.  Objective: BP (!) 154/94 (BP Location: Right Arm)   Pulse (!) 114   Temp 97.6 F (36.4 C)   Resp 20   Ht 5\' 9"  (1.753 m)   Wt 59 kg   SpO2 98%   BMI 19.20 kg/m   Gen: No distress, chronically ill-appearing Pulm: Diminished, nonlabored  CV: RRR, no MRG GI: Soft, NT, ND, +BS Neuro: Alert and oriented. No new focal deficits. Ext: Warm, no deformities Skin: R volar ulnar distal forearm with ~1cm deep linear laceration with slough, no purulence, moderate surrounding erythema.  Assessment & Plan: Principal Problem:   Wound cellulitis Active Problems:   Alcohol dependency (HCC)   COPD (chronic obstructive pulmonary disease) (HCC)   Hypokalemia   Hypocalcemia  Cellulitis, infected wound right forearm: No underlying bony abnormality or radiopaque foreign body on XR.  - Tetanus vaccination updated.  - Orthopedic surgery consulted, per verbal report, no need for surgical intervention.  - Continue antibiotics, but should be able to change to oral cefadroxil and doxycycline today. Blood cultures NGTD.  Alcohol intoxication and withdrawal: Mild symptoms at this time. Level was 222 on 10/6. - Cessation counseling provided - Continue CIWA  COPD: Quiescent  Hypokalemia: Supplemented, resolved.  HTN:  - Restart home propranolol  Anxiety:  - Continue home lithium, seroquel  History of GI bleed: None currently noted. Hgb wnl, stable.   Tyrone Nine, MD [facility] [email or web address] 07/27/2023, 2:52 PM
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
## 2023-07-27 NOTE — Plan of Care (Signed)
  Problem: Clinical Measurements: Goal: Will remain free from infection Outcome: Progressing Goal: Diagnostic test results will improve Outcome: Progressing   Problem: Nutrition: Goal: Adequate nutrition will be maintained Outcome: Progressing   Problem: Coping: Goal: Level of anxiety will decrease Outcome: Progressing   

## 2023-07-27 NOTE — Plan of Care (Signed)

## 2023-07-28 ENCOUNTER — Other Ambulatory Visit (HOSPITAL_COMMUNITY): Payer: Self-pay

## 2023-07-28 DIAGNOSIS — L0390 Cellulitis, unspecified: Secondary | ICD-10-CM | POA: Diagnosis not present

## 2023-07-28 MED ORDER — CEFADROXIL 500 MG PO CAPS: 500.0000 mg | ORAL_CAPSULE | Freq: Two times a day (BID) | ORAL | 0 refills | Status: AC

## 2023-07-28 MED ORDER — DOXYCYCLINE HYCLATE 100 MG PO TABS: 100.0000 mg | ORAL_TABLET | Freq: Two times a day (BID) | ORAL | 0 refills | Status: AC

## 2023-07-28 MED ORDER — DOXYCYCLINE HYCLATE 100 MG PO TABS
100.0000 mg | ORAL_TABLET | Freq: Two times a day (BID) | ORAL | 0 refills | Status: DC
  Filled 2023-07-28: qty 14, 7d supply, fill #0

## 2023-07-28 MED ORDER — CEFADROXIL 500 MG PO CAPS
500.0000 mg | ORAL_CAPSULE | Freq: Two times a day (BID) | ORAL | 0 refills | Status: DC
Start: 2023-07-28 — End: 2023-07-28
  Filled 2023-07-28: qty 14, 7d supply, fill #0
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
## 2023-07-28 NOTE — Plan of Care (Signed)
  Problem: Education: Goal: Knowledge of General Education information will improve Description: Including pain rating scale, medication(s)/side effects and non-pharmacologic comfort measures Outcome: Progressing   Problem: Clinical Measurements: Goal: Ability to maintain clinical measurements within normal limits will improve Outcome: Progressing Goal: Will remain free from infection Outcome: Progressing   Problem: Nutrition: Goal: Adequate nutrition will be maintained Outcome: Progressing   Problem: Coping: Goal: Level of anxiety will decrease Outcome: Progressing   Problem: Pain Managment: Goal: General experience of comfort will improve Outcome: Progressing   Problem: Safety: Goal: Ability to remain free from injury will improve Outcome: Progressing   Problem: Skin Integrity: Goal: Risk for impaired skin integrity will decrease Outcome: Progressing

## 2023-07-28 NOTE — TOC Transition Note (Signed)
Transition of Care [facility]) - CM/SW Discharge Note   Patient Details  Name: Terry Bradley MRN: [id number] Date of Birth: [birth year]  [facility] CM/SW Contact:  Otelia Santee, LCSW Phone Number: 07/28/2023, 1:47 PM   Clinical Narrative:    Pt to return home at discharge. Pt's ACT Team to visit pt tomorrow, 10/11. Medications delivered to pt's room by [facility] pharmacy. Bus pass provided to pt. DC summary faxed to [facility] at [phone].    Final next level of care: Home/Self Care Barriers to Discharge: No Barriers Identified   Patient Goals and CMS Choice CMS Medicare.gov Compare Post Acute Care list provided to::  (NA) Choice offered to / list presented to :  (NA)  Discharge Placement                         Discharge Plan and Services Additional resources added to the After Visit Summary for                  DME Arranged: N/A DME Agency: NA                  Social Determinants of Health (SDOH) Interventions SDOH Screenings   Food Insecurity: No Food Insecurity (07/25/2023)  Housing: Patient Declined (07/25/2023)  Transportation Needs: No Transportation Needs (07/25/2023)  Utilities: Patient Declined (07/25/2023)  Tobacco Use: High Risk (07/24/2023)     Readmission Risk Interventions    07/25/2023   11:23 AM  Readmission Risk Prevention Plan  Post Dischage Appt Complete  Medication Screening Complete  Transportation Screening Complete

## 2023-07-29 LAB — CULTURE, BLOOD (ROUTINE X 2)
Culture: NO GROWTH
Culture: NO GROWTH
Special Requests: ADEQUATE
Special Requests: ADEQUATE
